# Patient Record
Sex: Female | Born: 1995 | Race: White | Hispanic: No | Marital: Single | Smoking: Current some day smoker
Health system: Southern US, Community
[De-identification: ages and names within clinical notes are randomized; demographics above are authoritative.]

## PROBLEM LIST (undated history)

## (undated) DIAGNOSIS — H539 Unspecified visual disturbance: Secondary | ICD-10-CM

## (undated) DIAGNOSIS — F99 Mental disorder, not otherwise specified: Secondary | ICD-10-CM

## (undated) DIAGNOSIS — G8929 Other chronic pain: Secondary | ICD-10-CM

## (undated) DIAGNOSIS — M25569 Pain in unspecified knee: Secondary | ICD-10-CM

## (undated) DIAGNOSIS — F419 Anxiety disorder, unspecified: Secondary | ICD-10-CM

## (undated) HISTORY — DX: Pain in unspecified knee: M25.569

## (undated) HISTORY — DX: Mental disorder, not otherwise specified: F99

## (undated) HISTORY — PX: NO PAST SURGERIES: SHX2092

## (undated) HISTORY — DX: Unspecified visual disturbance: H53.9

## (undated) HISTORY — DX: Other chronic pain: G89.29

---
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
## 2017-03-06 ENCOUNTER — Ambulatory Visit: Payer: Self-pay | Admitting: Orthopaedic Surgery

## 2017-09-08 ENCOUNTER — Encounter: Payer: Self-pay | Admitting: Nurse Practitioner

## 2017-09-08 ENCOUNTER — Ambulatory Visit: Payer: BLUE CROSS/BLUE SHIELD | Admitting: Nurse Practitioner

## 2017-09-08 VITALS — BP 124/82 | Ht 63.25 in | Wt 205.0 lb

## 2017-09-08 DIAGNOSIS — R5383 Other fatigue: Secondary | ICD-10-CM | POA: Diagnosis not present

## 2017-09-08 DIAGNOSIS — R0602 Shortness of breath: Secondary | ICD-10-CM | POA: Diagnosis not present

## 2017-09-08 DIAGNOSIS — Z113 Encounter for screening for infections with a predominantly sexual mode of transmission: Secondary | ICD-10-CM | POA: Diagnosis not present

## 2017-09-08 NOTE — Progress Notes (Signed)
Subjective: Presents for complaints of fatigue over the past year.  Lately describes herself as feeling "dead tired" at times.  Other than going to work, patient states all she wants to do asleep or staying in the bed.  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  Feels mildly winded at times with prolonged walking that only occurs after she is worked.  Otherwise no chest pain/ischemic type pain palpitations or shortness of breath.  Regular menses, normal flow lasting 3 to 4 days.  Moderate cramping right at the beginning but that is relieved with drinking a large amount of water.  Had a new sexual partner about 2 to 3 weeks ago.  Had one syncopal episode around 2 months ago.  Patient was at her previous job at [employer], had just come in from smoking a cigarette.  Her last meal was about 4 hours before.  Had been drinking plenty of fluids.  Patient felt like she was going to faint, leaned against the wall and then she passed out briefly.  Bystanders said she was out maybe 30 seconds, no more than a minute.  No seizure activity.  Was given water and a banana afterwards.  She was tired but was able to complete her shift at work.  Has felt a few episodes of mild fainty feeling but no further syncopal episodes.  Had one episode last week where she sat down quickly when she felt like this.  Has occasional brief cramping headache around the left parietal area lasting no more than 5 to 10 minutes.  No difficulty speaking or swallowing.  No visual changes.  No numbness or weakness of the face arms or legs.  NAD.  Alert, oriented.  TMs mildly retracted, no erythema. [facility] 09/08/2017 09/30/2016  Decreased Interest 3 1  Down, Depressed, Hopeless 2 1  PHQ - 2 Score 5 2  Altered sleeping 3 1  Tired, decreased energy 3 3  Change in appetite 2 1  Feeling bad or failure about yourself  1 1  Trouble concentrating 2 1   Moving slowly or fidgety/restless 1 0  Suicidal thoughts 1 1  PHQ-9 Score 18 10  Difficult doing work/chores Extremely dIfficult Somewhat difficult   GAD 7 : Generalized Anxiety Score 09/08/2017  Nervous, Anxious, on Edge 2  Control/stop worrying 0  Worry too much - different things 0  Trouble relaxing 0  Restless 1  Easily annoyed or irritable 2  Afraid - awful might happen 1  Total GAD 7 Score 6  Anxiety Difficulty Somewhat difficult      Objective:   BP 124/82   Ht 5' 3.25" (1.607 m)   Wt 205 lb (93 kg)   LMP 09/01/2017 (Approximate)   BMI 36.03 kg/m  Pupils equal and reactive to light.  EOMs intact without nystagmus.  Pharynx clear.  Neck supple with minimal adenopathy.  Thyroid nontender to palpation, no mass or goiter noted.  Lungs clear.  Heart regular rate rhythm.  No murmur gallop noted.  EKG normal.  Point-to-point localization normal limit.  Hand strength 5+ black.  Patellar reflexes normal.  Gait normal.  Romberg negative.  Urine hCG negative.  Assessment:  Fatigue, unspecified type - Plan: Chlamydia/Gonococcus/Trichomonas, NAA, CBC with Differential, TSH, T4, free, Comprehensive Metabolic Panel (CMET), Vitamin D (25 hydroxy), Hemoglobin A1c  Shortness of breath - Plan: EKG 12-Lead  Screen for STD (sexually transmitted  disease) - Plan: Chlamydia/Gonococcus/Trichomonas, NAA    Plan: Further follow-up based on lab results.  Feel that some of her fatigue may be related to mental health issues.  Shortness of breath is very specific, only associated with prolonged walking and only occurs after work, no other time.  Warning signs reviewed.  Recheck here as needed.  Discussed safe sex issues.  She is getting labs today. Will also discuss results of GAD and PHQ when labs come back.

## 2017-09-09 LAB — CBC WITH DIFFERENTIAL/PLATELET
Basophils Absolute: 0.1 10*3/uL (ref 0.0–0.2)
Basos: 1 %
EOS (ABSOLUTE): 0.2 10*3/uL (ref 0.0–0.4)
Eos: 3 %
Hematocrit: 44 % (ref 34.0–46.6)
Hemoglobin: 14.4 g/dL (ref 11.1–15.9)
Immature Grans (Abs): 0 10*3/uL (ref 0.0–0.1)
Immature Granulocytes: 0 %
Lymphocytes Absolute: 2.4 10*3/uL (ref 0.7–3.1)
Lymphs: 30 %
MCH: 30.6 pg (ref 26.6–33.0)
MCHC: 32.7 g/dL (ref 31.5–35.7)
MCV: 94 fL (ref 79–97)
Monocytes Absolute: 0.5 10*3/uL (ref 0.1–0.9)
Monocytes: 6 %
Neutrophils Absolute: 4.7 10*3/uL (ref 1.4–7.0)
Neutrophils: 60 %
Platelets: 384 10*3/uL (ref 150–450)
RBC: 4.7 x10E6/uL (ref 3.77–5.28)
RDW: 13 % (ref 12.3–15.4)
WBC: 7.9 10*3/uL (ref 3.4–10.8)

## 2017-09-09 LAB — HEMOGLOBIN A1C
Est. average glucose Bld gHb Est-mCnc: 103 mg/dL
Hgb A1c MFr Bld: 5.2 % (ref 4.8–5.6)

## 2017-09-09 LAB — COMPREHENSIVE METABOLIC PANEL
ALT: 13 IU/L (ref 0–32)
AST: 14 IU/L (ref 0–40)
Albumin/Globulin Ratio: 1.5 (ref 1.2–2.2)
Albumin: 4.3 g/dL (ref 3.5–5.5)
Alkaline Phosphatase: 76 IU/L (ref 39–117)
BUN/Creatinine Ratio: 13 (ref 9–23)
BUN: 9 mg/dL (ref 6–20)
Bilirubin Total: 0.4 mg/dL (ref 0.0–1.2)
CO2: 24 mmol/L (ref 20–29)
Calcium: 9.8 mg/dL (ref 8.7–10.2)
Chloride: 102 mmol/L (ref 96–106)
Creatinine, Ser: 0.68 mg/dL (ref 0.57–1.00)
GFR calc Af Amer: 147 mL/min/{1.73_m2} (ref >59)
GFR calc non Af Amer: 127 mL/min/{1.73_m2} (ref >59)
Globulin, Total: 2.8 g/dL (ref 1.5–4.5)
Glucose: 86 mg/dL (ref 65–99)
Potassium: 4.5 mmol/L (ref 3.5–5.2)
Sodium: 140 mmol/L (ref 134–144)
Total Protein: 7.1 g/dL (ref 6.0–8.5)

## 2017-09-09 LAB — T4, FREE: Free T4: 1.06 ng/dL (ref 0.93–1.60)

## 2017-09-09 LAB — TSH: TSH: 0.891 u[IU]/mL (ref 0.450–4.500)

## 2017-09-09 LAB — VITAMIN D 25 HYDROXY (VIT D DEFICIENCY, FRACTURES): Vit D, 25-Hydroxy: 28.2 ng/mL — ABNORMAL LOW (ref 30.0–100.0)

## 2017-09-10 ENCOUNTER — Encounter: Payer: Self-pay | Admitting: Family Medicine

## 2017-09-10 LAB — CHLAMYDIA/GONOCOCCUS/TRICHOMONAS, NAA
Chlamydia by NAA: NEGATIVE
Gonococcus by NAA: NEGATIVE
Trich vag by NAA: NEGATIVE

## 2017-10-28 ENCOUNTER — Other Ambulatory Visit: Payer: Self-pay | Admitting: *Deleted

## 2017-10-28 ENCOUNTER — Telehealth: Payer: Self-pay | Admitting: Family Medicine

## 2017-10-28 ENCOUNTER — Encounter: Payer: Self-pay | Admitting: Family Medicine

## 2017-10-28 ENCOUNTER — Ambulatory Visit: Payer: BLUE CROSS/BLUE SHIELD | Admitting: Family Medicine

## 2017-10-28 VITALS — BP 110/88 | Ht 63.25 in | Wt 213.0 lb

## 2017-10-28 DIAGNOSIS — F339 Major depressive disorder, recurrent, unspecified: Secondary | ICD-10-CM

## 2017-10-28 DIAGNOSIS — R5383 Other fatigue: Secondary | ICD-10-CM

## 2017-10-28 DIAGNOSIS — F411 Generalized anxiety disorder: Secondary | ICD-10-CM | POA: Diagnosis not present

## 2017-10-28 MED ORDER — ESCITALOPRAM OXALATE 10 MG PO TABS: ORAL_TABLET | ORAL | 5 refills | Status: DC

## 2017-10-28 NOTE — Progress Notes (Signed)
   Subjective:    Patient ID: Debra Stuart, female    DOB: 09/06/1995, 22 y.o.   MRN: [id number]  HPI  Patient is here today to follow up on anxiety and depression.  Pt has challenge with fatigue  Now ha a car  Living at home, overall decent with parents, occas squabbling  Considered moving , but did not   Long hx of feeling down, no suic thoughts  Has not had meds in the past  Pt has hx of tis, jerking at times, sometimes worse with anxiety, and tend s to occur during stress. ocurs about evey yr to year and a half  .        She states she is not on any antidepressants.  Review of Systems No headache, no major weight loss or weight gain, no chest pain no back pain abdominal pain no change in bowel habits complete ROS otherwise negative     Objective:   Physical Exam  Alert and oriented, vitals reviewed and stable, NAD ENT-TM's and ext canals WNL bilat via otoscopic exam Soft palate, tonsils and post pharynx WNL via oropharyngeal exam Neck-symmetric, no masses; thyroid nonpalpable and nontender Pulmonary-no tachypnea or accessory muscle use; Clear without wheezes via auscultation Card--no abnrml murmurs, rhythm reg and rate WNL Carotid pulses symmetric, without bruits       Assessment & Plan:  Impression depression with generalized anxiety disorder discussed.  Potential interventions discussed.  Patient is motivated to get started on some medication.  Notes substantial history within the family of this on both sides of the family.  Will initiate Lexapro.  Side effects discussed.  Of note patient was given Xanax by relative and had excessive sedation.  Warning signs discussed regarding the slight possibility that things can get worse.  Call us at once if this were to occur follow-up as scheduled.  Exercise also discussed.  Blood work also discussed at [facility]

## 2017-10-28 NOTE — Telephone Encounter (Signed)
[facility] in [place] did not have Lexapro in stock and patient wanted it sent to [facility] instead.

## 2017-10-28 NOTE — Patient Instructions (Signed)

## 2017-10-28 NOTE — Telephone Encounter (Signed)
Called [facility] and canceled rx and sent to [facility]

## 2017-11-19 ENCOUNTER — Ambulatory Visit (INDEPENDENT_AMBULATORY_CARE_PROVIDER_SITE_OTHER): Payer: BLUE CROSS/BLUE SHIELD | Admitting: Family Medicine

## 2017-11-19 ENCOUNTER — Encounter: Payer: Self-pay | Admitting: Family Medicine

## 2017-11-19 VITALS — BP 110/78 | Ht 63.0 in | Wt 212.6 lb

## 2017-11-19 DIAGNOSIS — F411 Generalized anxiety disorder: Secondary | ICD-10-CM

## 2017-11-19 DIAGNOSIS — F339 Major depressive disorder, recurrent, unspecified: Secondary | ICD-10-CM | POA: Diagnosis not present

## 2017-11-19 DIAGNOSIS — Z23 Encounter for immunization: Secondary | ICD-10-CM

## 2017-11-19 NOTE — Progress Notes (Signed)
   Subjective:    Patient ID: Debra LittenJulie Stuart, female    DOB: 06/03/1995, 22 y.o.   MRN: [id number]  HPIhaving some problems with taking lexapro. States she feels worse on the med. Stopped med 3 days ago. Wanting a lower dose of med.   Over time pt noted more on edge and feeling more anxious  Then mellowed out  Depression got to feeling worse  And tis got worse  Felt more frustrated  Called mom  Considered even going to the emergecy room  Stopped the meds, had more crying  And started feling beter fter the first day or so       Would like flu vaccine today.    Review of Systems No headache, no major weight loss or weight gain, no chest pain no back pain abdominal pain no change in bowel habits complete ROS otherwise negative     Objective:   Physical Exam Alert and oriented, vitals reviewed and stable, NAD ENT-TM's and ext canals WNL bilat via otoscopic exam Soft palate, tonsils and post pharynx WNL via oropharyngeal exam Neck-symmetric, no masses; thyroid nonpalpable and nontender Pulmonary-no tachypnea or accessory muscle use; Clear without wheezes via auscultation Card--no abnrml murmurs, rhythm reg and rate WNL Carotid pulses symmetric, without bruits        Assessment & Plan:  Impression depression/anxiety.  Worsening on current intervention.  Discussed.  In the past we recommended mental health referral and the patient declined this.  She is now more open minded to this.  Pt more receptie to h mh support.  We will hold off on further medication for now.  Rationale discussed.  Questions answered regarding referral process.  We will work on this  Greater than 50% of this 25 minute face to face visit was spent in counseling and discussion and coordination of care regarding the above diagnosis/diagnosies

## 2017-11-21 ENCOUNTER — Encounter: Payer: Self-pay | Admitting: Family Medicine

## 2017-12-09 ENCOUNTER — Ambulatory Visit: Payer: BLUE CROSS/BLUE SHIELD | Admitting: Family Medicine

## 2017-12-18 ENCOUNTER — Encounter: Payer: Self-pay | Admitting: Family Medicine

## 2017-12-18 ENCOUNTER — Ambulatory Visit (INDEPENDENT_AMBULATORY_CARE_PROVIDER_SITE_OTHER): Payer: BLUE CROSS/BLUE SHIELD | Admitting: Family Medicine

## 2017-12-18 VITALS — BP 110/72 | Temp 99.0°F | Ht 63.0 in | Wt 220.6 lb

## 2017-12-18 DIAGNOSIS — J329 Chronic sinusitis, unspecified: Secondary | ICD-10-CM | POA: Diagnosis not present

## 2017-12-18 DIAGNOSIS — J310 Chronic rhinitis: Secondary | ICD-10-CM

## 2017-12-18 MED ORDER — CEFPROZIL 500 MG PO TABS: 500.0000 mg | ORAL_TABLET | Freq: Two times a day (BID) | ORAL | 0 refills | Status: DC

## 2017-12-18 NOTE — Progress Notes (Signed)
   Subjective:    Patient ID: Debra Stuart, female    DOB: 07-11-1995, 22 y.o.   MRN: [id number]  Sinusitis  This is a new problem. The current episode started yesterday. Associated symptoms include congestion, coughing, headaches and a sore throat. (Wheezing ) Treatments tried: dayquil.   Sore throt nose dry and raw  Head hurting all over aching,   And facil tend and discomfort   Raw in the chest   Ps productive      low gr feve  No nausea no queazzy  No major pain   dayquil prn     Review of Systems  HENT: Positive for congestion and sore throat.   Respiratory: Positive for cough.   Neurological: Positive for headaches.       Objective:   Physical Exam  Alert, mild malaise. Hydration good Vitals stable. frontal/ maxillary tenderness evident positive nasal congestion. pharynx normal neck supple  lungs clear/no crackles or wheezes. heart regular in rhythm       Assessment & Plan:  Impression rhinosinusitis likely post viral, discussed with patient. plan antibiotics prescribed. Questions answered. Symptomatic care discussed. warning signs discussed. WSL
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
## 2018-01-12 ENCOUNTER — Encounter (HOSPITAL_COMMUNITY): Payer: Self-pay | Admitting: Psychiatry

## 2018-01-12 ENCOUNTER — Ambulatory Visit (INDEPENDENT_AMBULATORY_CARE_PROVIDER_SITE_OTHER): Payer: BLUE CROSS/BLUE SHIELD | Admitting: Psychiatry

## 2018-01-12 VITALS — BP 132/82 | HR 108 | Ht 63.0 in | Wt 211.0 lb

## 2018-01-12 DIAGNOSIS — F331 Major depressive disorder, recurrent, moderate: Secondary | ICD-10-CM | POA: Diagnosis not present

## 2018-01-12 MED ORDER — SERTRALINE HCL 50 MG PO TABS: ORAL_TABLET | ORAL | 0 refills | Status: DC

## 2018-01-12 NOTE — Patient Instructions (Addendum)
1. Start sertraline 25 mg daily for one week, then 50 mg daily  2. Return to clinic in one month for 30 mins  CONTACT INFORMATION  What to do if you need to get in touch with someone regarding a psychiatric issue:  1. EMERGENCY: For psychiatric emergencies (if you are suicidal or if there are any other safety issues) call 911 and/or go to your nearest Emergency Room immediately.   2. IF YOU NEED SOMEONE TO TALK TO RIGHT NOW: Given my clinical responsibilities, I may not be able to speak with you over the phone for a prolonged period of time.  a. You may always call The National Suicide Prevention Lifeline at 1-800-273-TALK 418-406-9816(8255).  b. Your county of residence will also have local crisis services. [place] [facility]: [facility] at [phone] (24 Hour Crisis Hotline)
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
## 2018-02-16 ENCOUNTER — Ambulatory Visit (INDEPENDENT_AMBULATORY_CARE_PROVIDER_SITE_OTHER): Payer: BLUE CROSS/BLUE SHIELD | Admitting: Psychiatry

## 2018-02-16 ENCOUNTER — Encounter (HOSPITAL_COMMUNITY): Payer: Self-pay | Admitting: Psychiatry

## 2018-02-16 VITALS — BP 116/69 | HR 67 | Ht 63.0 in | Wt 212.0 lb

## 2018-02-16 DIAGNOSIS — F330 Major depressive disorder, recurrent, mild: Secondary | ICD-10-CM

## 2018-02-16 MED ORDER — SERTRALINE HCL 50 MG PO TABS: 50.0000 mg | ORAL_TABLET | Freq: Every day | ORAL | 0 refills | Status: DC

## 2018-02-16 NOTE — Patient Instructions (Signed)
1. Continue sertraline 50 mg at night  2. Return to clinic in two months for 15 mins

## 2018-02-23 ENCOUNTER — Ambulatory Visit: Payer: BLUE CROSS/BLUE SHIELD | Admitting: Family Medicine

## 2018-02-23 ENCOUNTER — Encounter: Payer: Self-pay | Admitting: Family Medicine

## 2018-02-23 VITALS — BP 120/78 | Ht 63.0 in | Wt 218.0 lb

## 2018-02-23 DIAGNOSIS — R569 Unspecified convulsions: Secondary | ICD-10-CM | POA: Diagnosis not present
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
## 2018-03-05 ENCOUNTER — Encounter: Payer: Self-pay | Admitting: Neurology

## 2018-03-05 ENCOUNTER — Other Ambulatory Visit: Payer: Self-pay

## 2018-03-05 ENCOUNTER — Telehealth: Payer: Self-pay | Admitting: Neurology

## 2018-03-05 ENCOUNTER — Ambulatory Visit (INDEPENDENT_AMBULATORY_CARE_PROVIDER_SITE_OTHER): Payer: PRIVATE HEALTH INSURANCE | Admitting: Neurology

## 2018-03-05 VITALS — BP 117/84 | HR 105 | Resp 18 | Ht 63.0 in | Wt 210.0 lb

## 2018-03-05 DIAGNOSIS — R251 Tremor, unspecified: Secondary | ICD-10-CM

## 2018-03-05 NOTE — Telephone Encounter (Signed)
Medicaid order sent to GI. They will obtain the auth and reach out to the pt to schedule.  °

## 2018-03-05 NOTE — Progress Notes (Signed)
PATIENT: Debra Stuart DOB: 08/21/1995  Chief Complaint  Patient presents with  . Possible Seizures    PCP: Ardyth GalWilliam Luking. R/O sz./fim  . PCP     HISTORICAL  Debra Stuart is a 23 years old female, accompanied by her boyfriend, seen in request by her primary care physician Dr. Merlyn AlbertLuking, William S for uncontrollable body jerking movement, initial evaluation was on March 05, 2018.  I have reviewed and summarized the referring note from the referring physician.  She had a past medical history of depression anxiety, was recently started on treatment with Zoloft 50 mg at bedtime in November 2019.  Around that time, she had a worsening uncontrollable body jerking spells, she began to have this uncontrollable sudden body jerking movement around age 23, no loss of consciousness, variable form different times, usually triggered by stress, I witnessed she has forceful eye blinking, neck jerking movement during interview, lasting for few seconds, no loss of consciousness, reported not under voluntary control.  She also reported different forms of body jerking, including body thrashing like a fish on the floor, with no loss of consciousness, most recent one was 1 PM today right before her appointment, 1 hour after she find out that her cat has passed away, she was really upset,  She denies self injury during those spells, no tongue biting, no falling out of bed, no incontinence, it can happen few times a day, or every few days,  She reported father suffered seizure disorder, she desires further evaluation  Laboratory evaluation in July 2019 showed normal negative CBC, TSH, free T4, CMP, vitamin D was slightly decreased 28.2, A1c,  REVIEW OF SYSTEMS: Full 14 system review of systems performed and notable only for memory loss, confusion, weakness, depression, anxiety, too much sleep, decreased energy, disinterested in activities, racing thoughts, feeling cold, All other review of systems were  negative.  ALLERGIES: No Known Allergies  HOME MEDICATIONS: Current Outpatient Medications  Medication Sig Dispense Refill  . Biotin 1000 MCG CHEW Chew by mouth daily.    . Ibuprofen (ADVIL) 200 MG CAPS Take by mouth.    . levonorgestrel (MIRENA) 20 MCG/24HR IUD 1 each by Intrauterine route once.    . Multiple Vitamin (MULTIVITAMIN) capsule Take 1 capsule by mouth daily.    Marland Kitchen. OVER THE COUNTER MEDICATION Vit D 2000 mg once daily.    . sertraline (ZOLOFT) 50 MG tablet Take 1 tablet (50 mg total) by mouth at bedtime. 90 tablet 0   No current facility-administered medications for this visit.     PAST MEDICAL HISTORY: Past Medical History:  Diagnosis Date  . Chronic knee pain   . Chronic mental illness   . Vision abnormalities     PAST SURGICAL HISTORY: No past surgical history on file.  FAMILY HISTORY: Family History  Problem Relation Age of Onset  . Seizures Father   . Depression Father   . Drug abuse Father   . Anxiety disorder Sister   . OCD Sister   . Obsessive Compulsive Disorder Sister   . Depression Mother   . Depression Half-Brother     SOCIAL HISTORY: Social History   Socioeconomic History  . Marital status: Single    Spouse name: Not on file  . Number of children: Not on file  . Years of education: Not on file  . Highest education level: Not on file  Occupational History  . Not on file  Social Needs  . Financial resource strain: Not on file  .  Food insecurity:    Worry: Not on file    Inability: Not on file  . Transportation needs:    Medical: Not on file    Non-medical: Not on file  Tobacco Use  . Smoking status: Current Every Day Smoker    Types: E-cigarettes  . Smokeless tobacco: Never Used  Substance and Sexual Activity  . Alcohol use: No  . Drug use: No  . Sexual activity: Not Currently    Birth control/protection: I.U.D.  Lifestyle  . Physical activity:    Days per week: Not on file    Minutes per session: Not on file  . Stress:  Not on file  Relationships  . Social connections:    Talks on phone: Not on file    Gets together: Not on file    Attends religious service: Not on file    Active member of club or organization: Not on file    Attends meetings of clubs or organizations: Not on file    Relationship status: Not on file  . Intimate partner violence:    Fear of current or ex partner: Not on file    Emotionally abused: Not on file    Physically abused: Not on file    Forced sexual activity: Not on file  Other Topics Concern  . Not on file  Social History Narrative  . Not on file     PHYSICAL EXAM   Vitals:   03/05/18 1514  BP: 117/84  Pulse: (!) 105  Resp: 18  Weight: 210 lb (95.3 kg)  Height: 5\' 3"  (1.6 m)    Not recorded      Body mass index is 37.2 kg/m.  PHYSICAL EXAMNIATION:  Gen: NAD, conversant, well nourised, obese, well groomed                     Cardiovascular: Regular rate rhythm, no peripheral edema, warm, nontender. Eyes: Conjunctivae clear without exudates or hemorrhage Neck: Supple, no carotid bruits. Pulmonary: Clear to auscultation bilaterally   NEUROLOGICAL EXAM:  MENTAL STATUS: Speech:    Speech is normal; fluent and spontaneous with normal comprehension.  Cognition:     Orientation to time, place and person     Normal recent and remote memory     Normal Attention span and concentration     Normal Language, naming, repeating,spontaneous speech     Fund of knowledge   CRANIAL NERVES: CN II: Visual fields are full to confrontation. Fundoscopic exam is normal with sharp discs and no vascular changes. Pupils are round equal and briskly reactive to light. CN III, IV, VI: extraocular movement are normal. No ptosis. CN V: Facial sensation is intact to pinprick in all 3 divisions bilaterally. Corneal responses are intact.  CN VII: Face is symmetric with normal eye closure and smile. CN VIII: Hearing is normal to rubbing fingers CN IX, X: Palate elevates  symmetrically. Phonation is normal. CN XI: Head turning and shoulder shrug are intact CN XII: Tongue is midline with normal movements and no atrophy.  MOTOR: There is no pronator drift of out-stretched arms. Muscle bulk and tone are normal. Muscle strength is normal.  REFLEXES: Reflexes are 2+ and symmetric at the biceps, triceps, knees, and ankles. Plantar responses are flexor.  SENSORY: Intact to light touch, pinprick, positional sensation and vibratory sensation are intact in fingers and toes.  COORDINATION: Rapid alternating movements and fine finger movements are intact. There is no dysmetria on finger-to-nose and heel-knee-shin.  GAIT/STANCE: Posture is normal. Gait is steady with normal steps, base, arm swing, and turning. Heel and toe walking are normal. Tandem gait is normal.  Romberg is absent.   DIAGNOSTIC DATA (LABS, IMAGING, TESTING) - I reviewed patient records, labs, notes, testing and imaging myself where available.   ASSESSMENT AND PLAN  Milton Dorion is a 23 y.o. female   Body jerking spells  Semiology does not suggest the seizure disorder,  The witnessed sudden not jerking spells today actually suggestive of motor tics  Family history of epilepsy, she desires further evaluation, proceed with MRI of the brain with without contrast, EEG, will call her report,  Continue to work with her primary care, and psychiatrist for better mood control, will only return to clinic for new issues,  Levert Feinstein, M.D. Ph.D.  [facility] [address] [place], [place] [address] Ph: [phone] Fax: [phone]  CC: Merlyn Albert, MD

## 2018-03-06 ENCOUNTER — Encounter: Payer: Self-pay | Admitting: Neurology

## 2018-03-09 NOTE — Telephone Encounter (Signed)
Spoke to the patient she is aware.  °

## 2018-03-10 ENCOUNTER — Ambulatory Visit (INDEPENDENT_AMBULATORY_CARE_PROVIDER_SITE_OTHER): Payer: PRIVATE HEALTH INSURANCE | Admitting: Psychiatry

## 2018-03-10 ENCOUNTER — Encounter (HOSPITAL_COMMUNITY): Payer: Self-pay | Admitting: Psychiatry

## 2018-03-10 DIAGNOSIS — F330 Major depressive disorder, recurrent, mild: Secondary | ICD-10-CM
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
## 2018-03-18 ENCOUNTER — Other Ambulatory Visit: Payer: Self-pay | Admitting: Neurology

## 2018-03-20 ENCOUNTER — Other Ambulatory Visit: Payer: Self-pay

## 2018-03-25 ENCOUNTER — Other Ambulatory Visit: Payer: Self-pay | Admitting: *Deleted

## 2018-03-25 DIAGNOSIS — R251 Tremor, unspecified: Secondary | ICD-10-CM

## 2018-03-25 NOTE — Telephone Encounter (Signed)
Her insurance [employer] [place] is not in net work with GI that is where she was scheduled at.Debra Stuart is. When you get a chance can you put a new order in for [facility] and I will get her schedule there. The patient is aware.

## 2018-03-25 NOTE — Telephone Encounter (Signed)
Am better [place] Auth: [id number] (exp. 03/25/18 to 04/24/18) Medicaid Berkley Harvey: [id number] (exp. 03/25/18 to 04/24/18)  Patient is scheduled at [facility] for 04/02/18 arrival time is 10:30 AM. Patient is aware of time and day I did give her their number of [phone] incase she needed to r/s for any reason.

## 2018-03-25 NOTE — Telephone Encounter (Signed)
Noted, thank you

## 2018-03-30 ENCOUNTER — Other Ambulatory Visit: Payer: Self-pay

## 2018-04-02 ENCOUNTER — Ambulatory Visit (HOSPITAL_COMMUNITY): Payer: PRIVATE HEALTH INSURANCE

## 2018-04-06 ENCOUNTER — Other Ambulatory Visit: Payer: PRIVATE HEALTH INSURANCE
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
## 2018-04-20 ENCOUNTER — Ambulatory Visit (INDEPENDENT_AMBULATORY_CARE_PROVIDER_SITE_OTHER): Payer: PRIVATE HEALTH INSURANCE | Admitting: Psychiatry

## 2018-04-20 VITALS — BP 110/75 | HR 92 | Ht 63.0 in | Wt 218.0 lb

## 2018-04-20 DIAGNOSIS — F330 Major depressive disorder, recurrent, mild: Secondary | ICD-10-CM

## 2018-04-20 MED ORDER — VENLAFAXINE HCL ER 75 MG PO CP24: 75.0000 mg | ORAL_CAPSULE | Freq: Every day | ORAL | 1 refills | Status: DC

## 2018-04-20 MED ORDER — VENLAFAXINE HCL ER 37.5 MG PO CP24: ORAL_CAPSULE | ORAL | 0 refills | Status: DC

## 2018-04-20 NOTE — Patient Instructions (Signed)
1. Decrease sertraline 25 mg daily for one week, then discontinue 2. Start venlafaxine 37.5 mg daily for one week, then 75 mg daily  3. Return to clinic in two months for 15 mins

## 2018-04-27 ENCOUNTER — Ambulatory Visit (INDEPENDENT_AMBULATORY_CARE_PROVIDER_SITE_OTHER): Payer: PRIVATE HEALTH INSURANCE | Admitting: Psychiatry

## 2018-04-27 DIAGNOSIS — F330 Major depressive disorder, recurrent, mild: Secondary | ICD-10-CM
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
## 2018-05-11 ENCOUNTER — Ambulatory Visit (HOSPITAL_COMMUNITY): Payer: PRIVATE HEALTH INSURANCE | Admitting: Psychiatry

## 2018-05-18 ENCOUNTER — Telehealth: Payer: Self-pay | Admitting: Family Medicine

## 2018-05-18 MED ORDER — BENZONATATE 100 MG PO CAPS: ORAL_CAPSULE | ORAL | 0 refills | Status: DC

## 2018-05-18 NOTE — Telephone Encounter (Signed)
Throat does not hurt. Fever last night of 99.7 oral. Productive cough, no temp this morning. Can hear a little rattle when cough, using inhaler. Runny nose. Claritin given and did help a little bit. Pt mom states that it is possible allergies.

## 2018-05-18 NOTE — Telephone Encounter (Signed)
symmtom care only, canadd tess perles 100 mg one q six hr prn cough 30, likely allergy with maybe mild virus, chance of serious virus wtill low in community

## 2018-05-18 NOTE — Telephone Encounter (Signed)
Patient experiencing cough, and sore throat, mother states it's possibly allergy related, no sob, no fever, no diarrhea, no nausea or vomiting, advise.     Pharmacy:  [facility] - [place], [place] - [address]

## 2018-05-18 NOTE — Telephone Encounter (Signed)
Mom contacted and informed to do symptomatic care only but can add tess perles. Medication sent in and mom verbalized understanding.

## 2018-06-01 ENCOUNTER — Ambulatory Visit (INDEPENDENT_AMBULATORY_CARE_PROVIDER_SITE_OTHER): Payer: PRIVATE HEALTH INSURANCE | Admitting: Psychiatry

## 2018-06-01 ENCOUNTER — Other Ambulatory Visit: Payer: Self-pay

## 2018-06-01 DIAGNOSIS — F330 Major depressive disorder, recurrent, mild: Secondary | ICD-10-CM | POA: Diagnosis not present

## 2018-06-01 NOTE — Progress Notes (Signed)
  Virtual Visit via Video Note  I connected with Debra Stuart on 06/01/18 at 11:00 AM EDT by a video enabled telemedicine application and verified that I am speaking with the correct person using two identifiers.   I discussed the limitations of evaluation and management by telemedicine and the availability of in person appointments. The patient expressed understanding and agreed to proceed.  I provided 45 minutes of non-face-to-face time during this encounter.   Adah Salvage, LCSW   THERAPIST PROGRESS NOTE  Session Time: Monday 06/01/2018 11:00 AM - 11:45 AM   Participation Level: Active  Behavioral Response: Casual/alert, anxious  Type of Therapy: Individual Therapy  Treatment Goals addressed: establish rapport, learn and implement cognitive and behavioral strategies to overcome depression  Interventions: CBT and Supportive  Summary: Debra Stuart is a 23 y.o. female who is referred for services by psychiatrist Dr. Vanetta Shawl due to experiencing symptoms of anxiety and depression. Symptoms began about 7 years ago. Patient denies any previous psychiatric hospitalizations. She was seen in this practice for an assessment appointment in 2018. Patient recently started taking zoloft as prescribed by psyichiatrist Dr. Vanetta Shawl. She reports periods of depression and difficulty coping with stress. Per her report, her response is extreme when she becomes stressed. She screams and and has panic attacks. Other symptoms include difficulty concentrating, fatigue, change in appetite, and irritability.   Patient last was seen in March 2020. She reports less depressed mood but increased stress and anxiety related to impact of coronavirus. She works as a Conservation officer, nature for [employer] and reports stress related to negative comments from customers. She also fears she could possibly bring home germs to her father who has COPD and her mother and sister who both have asthma. She reports adequate sleep and improved eating  patterns. She has maintained contact with boyfriend. Patient reports she has been using deep breathing and self-talk to try to cope.   Suicidal/Homicidal: Nowithout intent/plan  Therapist Response:  reviewed symptoms, praised and reinforced patient's improved self-care regarding nutrition/use of deep breathing/ and self-talk, discussed effects,  discussed stressors, facilitated expression of thoughts and feelings, validated and normalized feelings, discussed ways patient can use support system at work and the precautions patient is using,  assisted patient identify ways to improve self-care regarding exercise, developed plan for patient to walk 20 minutes per day 2 days a week, assisted patient practice a relaxation technique using imagery, developed plan with patient to do imagery/meditation daily,   Plan: Return again in 2 weeks.  Diagnosis: Axis I: MDD    Axis II: Deferred    Adah Salvage, LCSW 06/01/2018

## 2018-06-02 ENCOUNTER — Ambulatory Visit (INDEPENDENT_AMBULATORY_CARE_PROVIDER_SITE_OTHER): Payer: Self-pay | Admitting: Family Medicine

## 2018-06-02 DIAGNOSIS — R21 Rash and other nonspecific skin eruption: Secondary | ICD-10-CM

## 2018-06-02 MED ORDER — TRIAMCINOLONE ACETONIDE 0.1 % EX CREA: 1.0000 "application " | TOPICAL_CREAM | Freq: Two times a day (BID) | CUTANEOUS | 0 refills | Status: DC

## 2018-06-02 NOTE — Progress Notes (Signed)
   Subjective:    Patient ID: Debra Stuart, female    DOB: 09-20-1995, 22 y.o.   MRN: [id number] Visit done video and audio Rash  This is a new problem. Episode onset: 3 -4 days ago. The affected locations include the face (spreading to left shoulder). The rash is characterized by burning and peeling. Associated with: used a different facial cleanser the day before rash broke out. Treatments tried: benadryl, hydrocortisone.   Virtual Visit via Telephone Note  I connected with Debra Stuart on 06/02/18 at  1:40 PM EDT by telephone and verified that I am speaking with the correct person using two identifiers.   I discussed the limitations, risks, security and privacy concerns of performing an evaluation and management service by telephone and the availability of in person appointments. I also discussed with the patient that there may be a patient responsible charge related to this service. The patient expressed understanding and agreed to proceed.   History of Present Illness:    Observations/Objective:   Assessment and Plan:   Follow Up Instructions:    I discussed the assessment and treatment plan with the patient. The patient was provided an opportunity to ask questions and all were answered. The patient agreed with the plan and demonstrated an understanding of the instructions.   The patient was advised to call back or seek an in-person evaluation if the symptoms worsen or if the condition fails to improve as anticipated.  I provided 10 minutes of non-face-to-face time during this encounter.     Review of Systems  Skin: Positive for rash.       Objective:   Physical Exam  Mild erythematous patchy pruritic rash forehead cheeks had applied and new cosmetic      Assessment & Plan:  Impression hypersensitive rash plan triamcinolone cream twice daily to affected area symptom care discussed

## 2018-06-08 IMAGING — MR MR KNEE*L* W/O CM
4 of 6 series · 18 of 40 positions shown · non-contrast
Comparison: Radiographs 09/30/2016

CLINICAL DATA: Fell at work 1 year ago.  Persistent knee pain.

EXAM:
MRI OF THE LEFT KNEE WITHOUT CONTRAST
TECHNIQUE: Multiplanar, multisequence MR imaging of the knee was performed. No
intravenous contrast was administered.

[Series 6: pdfs sag · sagittal · 3.0mm · 0.24mm/px · 5 of 28 slices shown]
[im 1/28]
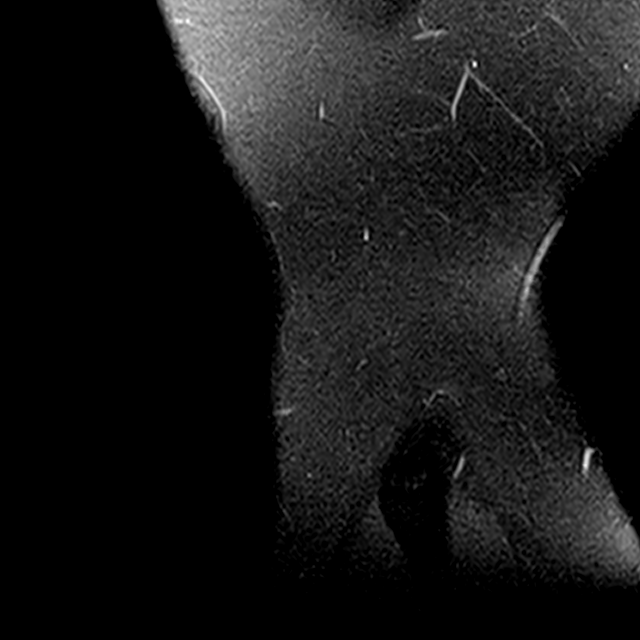
[im 5/28]
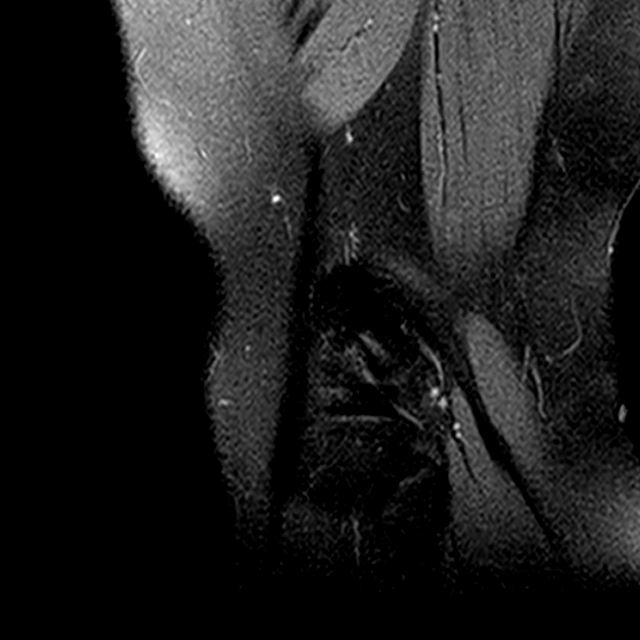
[im 10/28]
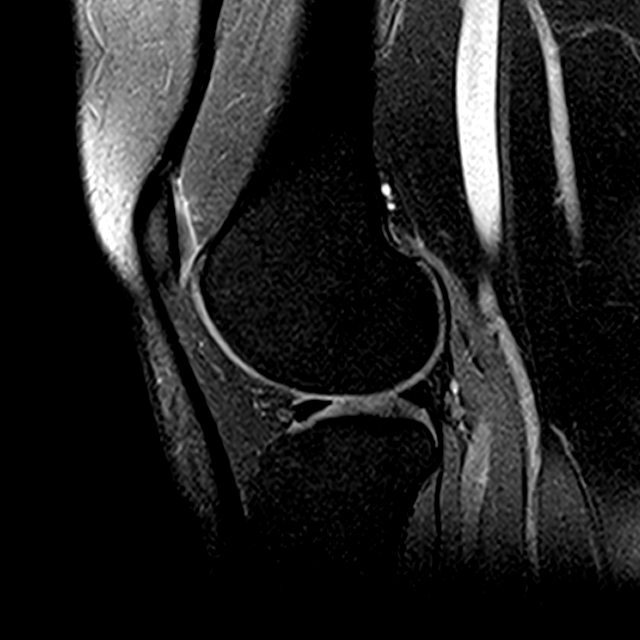
[im 14/28]
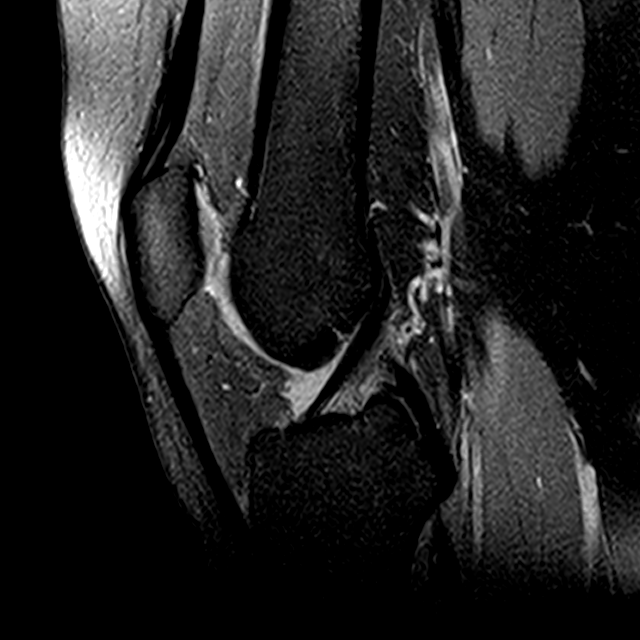
[im 23/28]
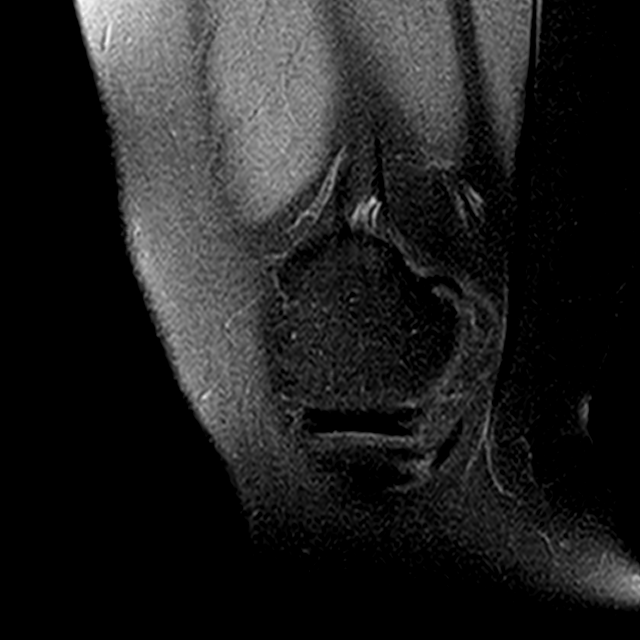

[Series 10: pdfs axial blade · axial · 3.0mm · 0.61mm/px · z∈[-83,-11]mm · 3 of 30 slices shown]
[im 5/30]
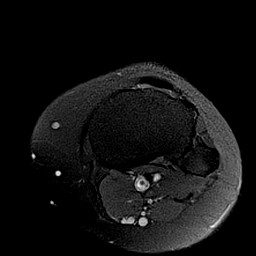
[im 17/30]
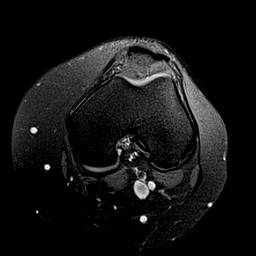
[im 25/30]
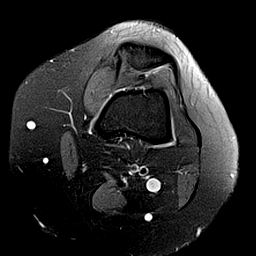

[Series 11: pdfs cor blade · coronal · 3.0mm · 0.51mm/px · 3 of 24 slices shown]
[im 4/24]
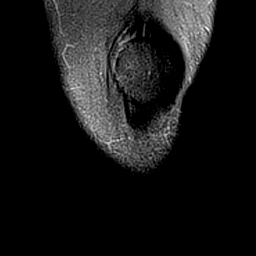
[im 12/24]
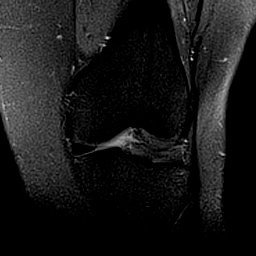
[im 20/24]
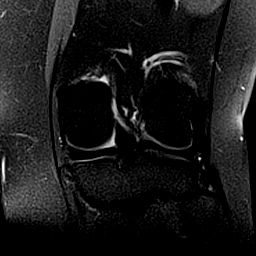

[Series 13: T1 · coronal · 3.0mm · 0.20mm/px · 7 of 24 slices shown]
[im 1/24]
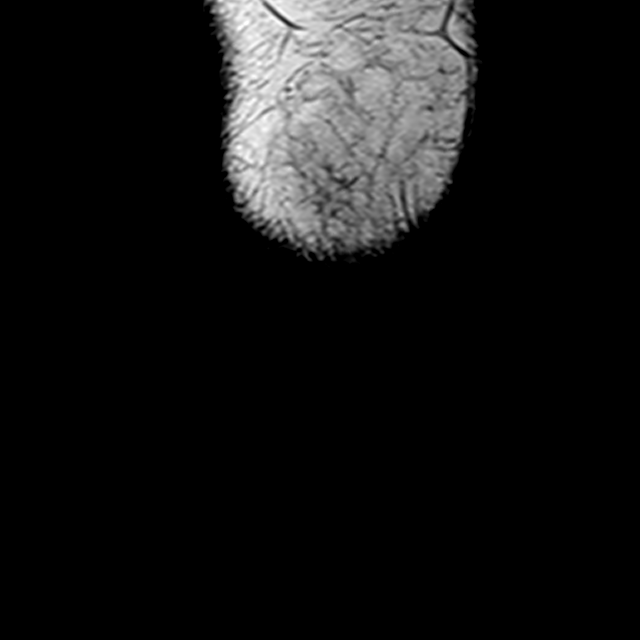
[im 4/24]
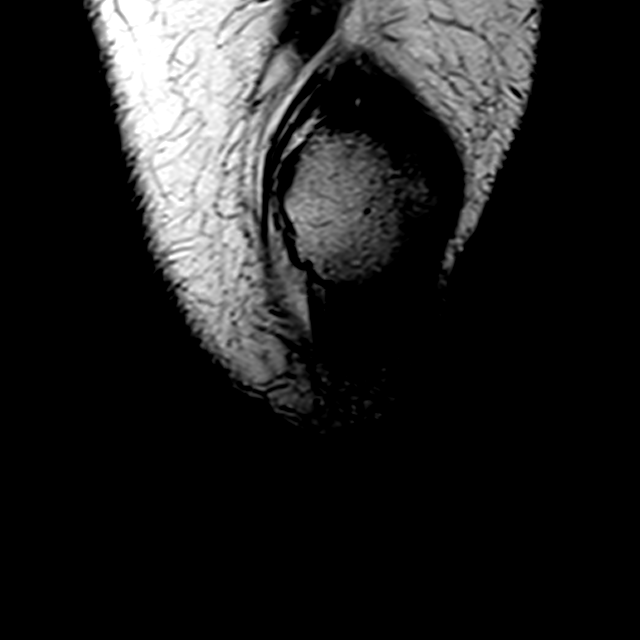
[im 8/24]
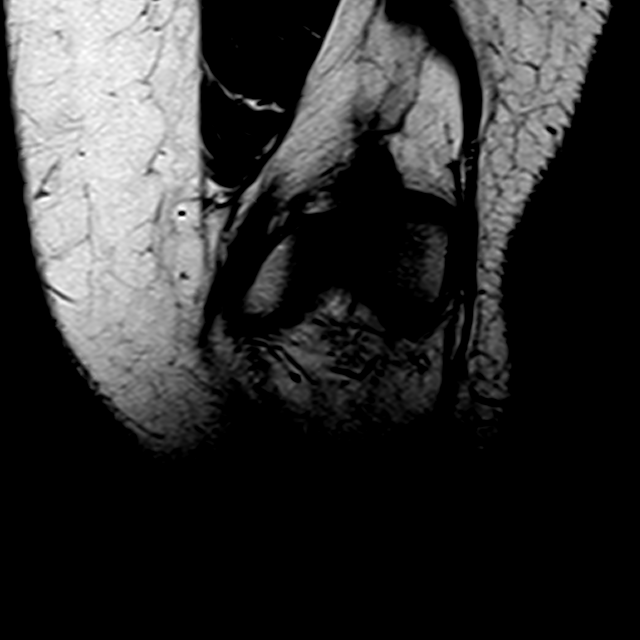
[im 12/24]
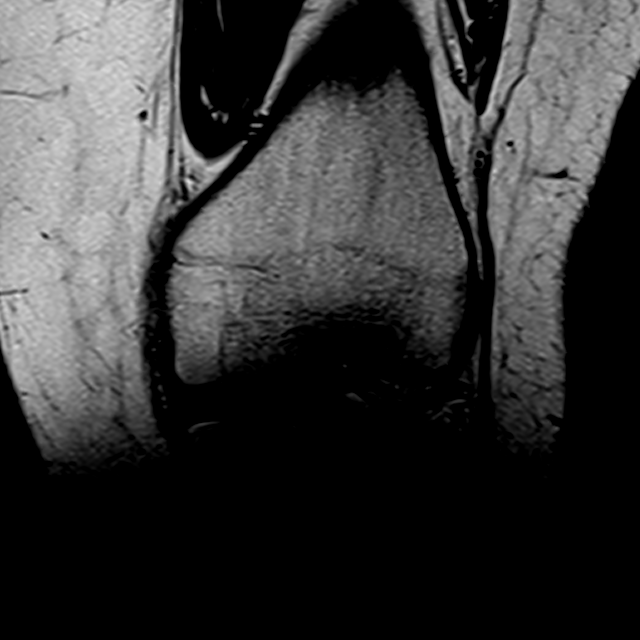
[im 16/24]
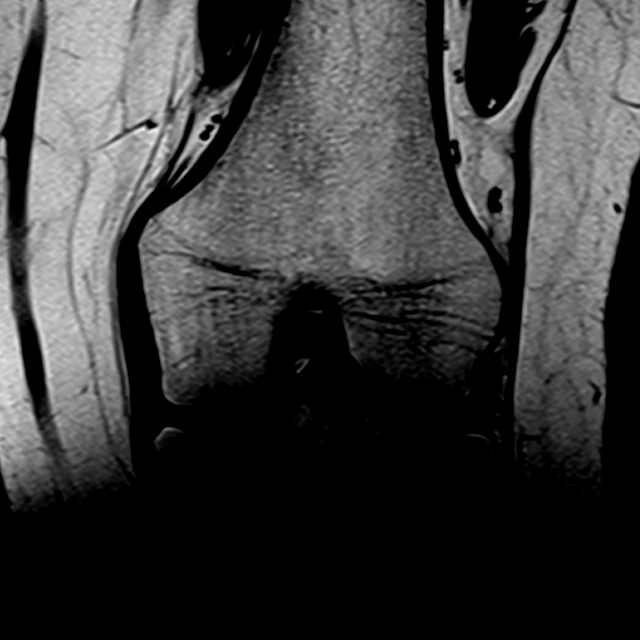
[im 20/24]
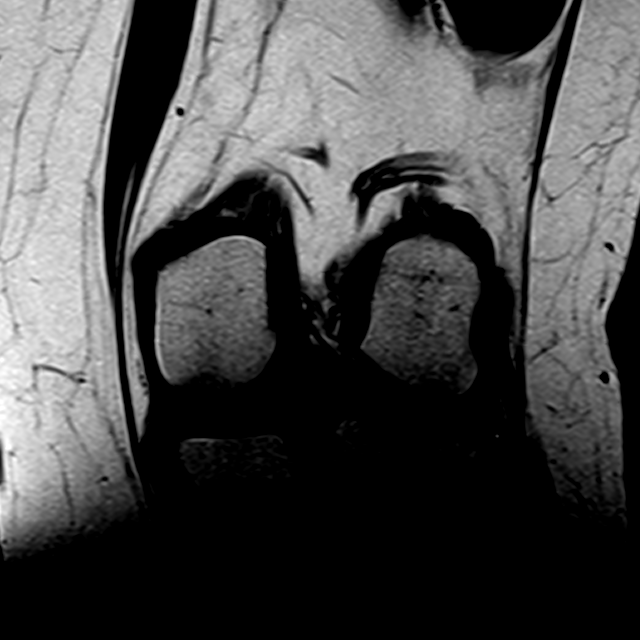
[im 24/24]
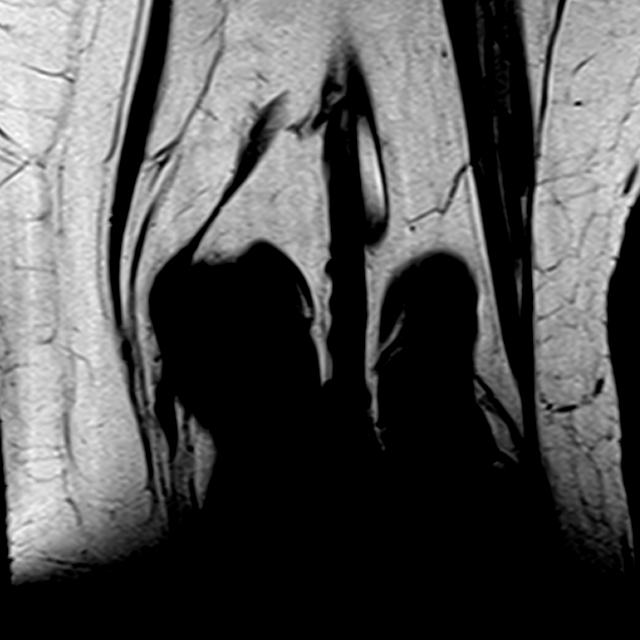

[18 of 40 positions shown; findings below may reference images not displayed]

FINDINGS: MENISCI

Medial meniscus:  Intact

Lateral meniscus:  Intact

LIGAMENTS

Cruciates:  Intact

Collaterals:  Intact

CARTILAGE

Patellofemoral:  Normal

Medial:  Normal

Lateral:  Normal

Joint:  No joint effusion or synovitis.

Popliteal Fossa:  No popliteal mass or Baker's cyst.

Extensor Mechanism: The patella retinacular structures are intact an
the quadriceps and patellar tendons are intact.

Bones: No acute bony findings. No bone lesions. No osteochondral
abnormality.

Other: Normal knee musculature.
IMPRESSION: Unremarkable MR examination of the knee. No findings for internal
derangement.

## 2018-06-15 ENCOUNTER — Ambulatory Visit (HOSPITAL_COMMUNITY): Payer: PRIVATE HEALTH INSURANCE | Admitting: Psychiatry

## 2018-06-15 ENCOUNTER — Other Ambulatory Visit: Payer: Self-pay
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
## 2018-06-22 ENCOUNTER — Encounter (HOSPITAL_COMMUNITY): Payer: Self-pay | Admitting: Psychiatry

## 2018-06-22 ENCOUNTER — Other Ambulatory Visit: Payer: Self-pay

## 2018-06-22 ENCOUNTER — Ambulatory Visit (INDEPENDENT_AMBULATORY_CARE_PROVIDER_SITE_OTHER): Payer: PRIVATE HEALTH INSURANCE | Admitting: Psychiatry

## 2018-06-22 DIAGNOSIS — F330 Major depressive disorder, recurrent, mild: Secondary | ICD-10-CM

## 2018-06-22 MED ORDER — VENLAFAXINE HCL ER 75 MG PO CP24: 75.0000 mg | ORAL_CAPSULE | Freq: Every day | ORAL | 1 refills | Status: DC

## 2018-06-22 NOTE — Patient Instructions (Addendum)
1. Start venlafaxine 75 mg daily 2. Next appointment 6/15 at 3:50

## 2018-06-29 ENCOUNTER — Other Ambulatory Visit: Payer: Self-pay

## 2018-06-29 ENCOUNTER — Ambulatory Visit (HOSPITAL_COMMUNITY): Payer: PRIVATE HEALTH INSURANCE | Admitting: Psychiatry
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
## 2018-08-10 ENCOUNTER — Other Ambulatory Visit: Payer: Self-pay

## 2018-08-10 ENCOUNTER — Encounter (HOSPITAL_COMMUNITY): Payer: Self-pay | Admitting: Psychiatry

## 2018-08-10 ENCOUNTER — Ambulatory Visit (INDEPENDENT_AMBULATORY_CARE_PROVIDER_SITE_OTHER): Payer: PRIVATE HEALTH INSURANCE | Admitting: Psychiatry

## 2018-08-10 DIAGNOSIS — F330 Major depressive disorder, recurrent, mild: Secondary | ICD-10-CM | POA: Diagnosis not present

## 2018-08-10 DIAGNOSIS — F411 Generalized anxiety disorder: Secondary | ICD-10-CM | POA: Diagnosis not present

## 2018-08-10 MED ORDER — VENLAFAXINE HCL ER 75 MG PO CP24: 75.0000 mg | ORAL_CAPSULE | Freq: Every day | ORAL | 0 refills | Status: DC

## 2018-08-10 NOTE — Patient Instructions (Signed)
1. Continue venlafaxine 75 mg daily 2.Next appointment: 8/3 at 11 AM
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
## 2018-09-28 ENCOUNTER — Ambulatory Visit (INDEPENDENT_AMBULATORY_CARE_PROVIDER_SITE_OTHER): Payer: PRIVATE HEALTH INSURANCE | Admitting: Psychiatry

## 2018-09-28 ENCOUNTER — Other Ambulatory Visit: Payer: Self-pay

## 2018-09-28 ENCOUNTER — Encounter (HOSPITAL_COMMUNITY): Payer: Self-pay | Admitting: Psychiatry

## 2018-09-28 DIAGNOSIS — R569 Unspecified convulsions: Secondary | ICD-10-CM

## 2018-09-28 DIAGNOSIS — F330 Major depressive disorder, recurrent, mild: Secondary | ICD-10-CM

## 2018-09-28 MED ORDER — VENLAFAXINE HCL ER 75 MG PO TB24: 75.0000 mg | ORAL_TABLET | Freq: Every day | ORAL | 1 refills | Status: DC

## 2018-09-28 NOTE — Patient Instructions (Signed)
1. Reinitiate venlafaxine 75 mg daily 2.Next appointment: 9/22 at 1:40
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
## 2018-11-17 ENCOUNTER — Telehealth (HOSPITAL_COMMUNITY): Payer: Self-pay | Admitting: Psychiatry

## 2018-11-17 ENCOUNTER — Ambulatory Visit (HOSPITAL_COMMUNITY): Payer: PRIVATE HEALTH INSURANCE | Admitting: Psychiatry

## 2018-11-17 ENCOUNTER — Other Ambulatory Visit: Payer: Self-pay

## 2018-11-17 NOTE — Telephone Encounter (Signed)
Sent link for video visit through [employer] [facility]. Patient did not sign in. Called the patient  twice for appointment scheduled today. The patient did not answer the phone. Left voice message to contact the office.  

## 2019-03-24 ENCOUNTER — Encounter: Payer: Self-pay | Admitting: Family Medicine

## 2019-03-25 ENCOUNTER — Encounter: Payer: Self-pay | Admitting: Family Medicine

## 2019-04-06 ENCOUNTER — Encounter: Payer: Self-pay | Admitting: Family Medicine

## 2019-04-06 ENCOUNTER — Ambulatory Visit (INDEPENDENT_AMBULATORY_CARE_PROVIDER_SITE_OTHER): Payer: 59 | Admitting: Family Medicine

## 2019-04-06 ENCOUNTER — Other Ambulatory Visit: Payer: Self-pay

## 2019-04-06 DIAGNOSIS — U071 COVID-19: Secondary | ICD-10-CM

## 2019-04-06 MED ORDER — ALBUTEROL SULFATE HFA 108 (90 BASE) MCG/ACT IN AERS: 2.0000 | INHALATION_SPRAY | RESPIRATORY_TRACT | 1 refills | Status: DC | PRN

## 2019-04-06 MED ORDER — TRIAMCINOLONE ACETONIDE 0.1 % EX CREA: 1.0000 "application " | TOPICAL_CREAM | Freq: Two times a day (BID) | CUTANEOUS | 2 refills | Status: DC

## 2019-04-06 NOTE — Patient Instructions (Signed)
Person Under Monitoring Name: Debra Stuart  Location: [address] [place] [address]   CORONAVIRUS DISEASE 2019 (COVID-19) Guidance for Persons Under Investigation You are being tested for the virus that causes coronavirus disease 2019 (COVID-19). Public health actions are necessary to ensure protection of your health and the health of others, and to prevent further spread of infection. COVID-19 is caused by a virus that can cause symptoms, such as fever, cough, and shortness of breath. The primary transmission from person to person is by coughing or sneezing. On March 26, 2018, the World Health Organization announced a [employer] Emergency of International Concern and on March 27, 2018 the U.S. Department of Health and Human Services declared a public health emergency. If the virus that causesCOVID-19 spreads in the community, it could have severe public health consequences.  As a person under investigation for COVID-19, the [employer] of Health and [employer], Division of [employer] advises you to adhere to the following guidance until your test results are reported to you. If your test result is positive, you will receive additional information from your provider and your local health department at that time.   Remain at home until you are cleared by your health provider or public health authorities.   Keep a log of visitors to your home using the form provided. Any visitors to your home must be aware of your isolation status.  If you plan to move to a new address or leave the county, notify the local health department in your county.  Call a doctor or seek care if you have an urgent medical need. Before seeking medical care, call ahead and get instructions from the provider before arriving at the medical office, clinic or hospital. Notify them that you are being tested for the virus that causes COVID-19 so arrangements can be made, as necessary, to prevent  transmission to others in the healthcare setting. Next, notify the local health department in your county.  If a medical emergency arises and you need to call 911, inform the first responders that you are being tested for the virus that causes COVID-19. Next, notify the local health department in your county.  Adhere to all guidance set forth by the [facility] Division of [employer] for [facility] of patients that is based on guidance from the Center for Disease Control and Prevention with suspected or confirmed COVID-19. It is provided with this guidance for Persons Under Investigation.  Your health and the health of our community are our top priorities. Public Health officials remain available to provide assistance and counseling to you about COVID-19 and compliance with this guidance.  Provider: ____________________________________________________________ Date: ______/_____/_________  By signing below, you acknowledge that you have read and agree to comply with this Guidance for Persons Under Investigation. ______________________________________________________________ Date: ______/_____/_________  WHO DO I CALL? You can find a list of local health departments here: [email or web address] Health Department: ____________________________________________________________________ Contact Name: ________________________________________________________________________ Telephone: ___________________________________________________________________________  Nedra Hai, Division of Public Health, Communicable Disease Branch COVID-19 Guidance for Persons Under Investigation March 7, 2020Patient was educated regarding the warning signs.  Warning signs include trouble breathing, passing out or near syncope, persistent pain or pressure in the chest, new confusion or difficulty to arouse, bluish lips, unable to keep liquids down.  If emergency  symptoms are occurring then-if patient feels they are having medical emergency they are to call 911.  Otherwise they need to go to the emergency department.  For  milder cases home care is the best approach.  Home care minimizes exposure of the infected patient to others.  It is wise to self isolate. 10 ways to manage respiratory symptoms at home-per CDC guidelines  #1 stay at home-stay home from work and away from other public places.  If having to go out it is important to avoid public transportation ride sharing etc. #2 monitor your symptoms carefully-if symptoms worsen or show signs of emergent issues ER care may be necessary.  If more routine issues may call office for advice. #3 stay rested and stay hydrated. #4 if medical emergencies call 911 and notify dispatch personnel that you may have [phone] #5 cover your cough and sneezes #6 wash your hands often with soap and water for at least 20 seconds or use alcohol based hand sanitizer that contains at least 60% alcohol #7 as much as possible stay in a specific room at your home and stay away from other people in your home.  If possible please use separate bathroom.  If you have to be around other people in or outside of the home wear a facemask. #8 avoid sharing personal items-such as dishes, towels, bedding, do not drink after each other #9 clean all surfaces that are touched often like counters tabletops doorknobs use household cleaning sprays or wipes according to the label instructions #10 this illness can vary from person to person but most people over several days will gradually improve  Home self isolation guidelines CDC recommendations  Patients with symptoms consistent with Covid 19 should stay in self-isolation until: At least 3 days have passed since recovery-this is defined as no fevers (without medications), improvement in respiratory symptoms, and at least 10 days have passed since the symptoms first appeared.  Additional  information available at http://www.wolf.info/ and  also www.C19check.com is a good website that educates when a person should consider going to the ER versus home care.  The patient would just have to put in various information and it will automatically give advice.  Should the patient need further advice from [place] to call.

## 2019-04-06 NOTE — Progress Notes (Signed)
Subjective:  Initially was a virtual patient but because of the difficulty of her symptomatology she was brought to the office for further evaluation She was seen outside at the outside facility for safety purposes  Patient ID: Debra Stuart, female    DOB: 1995/04/23, 24 y.o.   MRN: [id number]  HPI3 days ago pt was feeling off and went had a rapid test done in [place] yesterday and it came back positive. Now having a headache, sob - feels like when she breaths in their is not enough room for the air, congestion, achy. Tried dayquil. The patient relates a lot of aching pain discomfort denies high fever chills sweats does relate feeling a little short winded when she moves around denies any injury.  Denies productive coughing. Would like refill of triamcinolone cream for a rash on her feet that comes and goes.  Patient states her rapid test was positive but she did a backup hoping it would be negative Virtual Visit via Telephone Note  I connected with Lurdes Haltiwanger on 04/06/19 at  2:00 PM EST by telephone and verified that I am speaking with the correct person using two identifiers.  Location: Patient: home Provider: office   I discussed the limitations, risks, security and privacy concerns of performing an evaluation and management service by telephone and the availability of in person appointments. I also discussed with the patient that there may be a patient responsible charge related to this service. The patient expressed understanding and agreed to proceed.   History of Present Illness:    Observations/Objective:   Assessment and Plan:   Follow Up Instructions:    I discussed the assessment and treatment plan with the patient. The patient was provided an opportunity to ask questions and all were answered. The patient agreed with the plan and demonstrated an understanding of the instructions.   The patient was advised to call back or seek an in-person evaluation if the symptoms  worsen or if the condition fails to improve as anticipated.  I provided 20 minutes of non-face-to-face time during this encounter.      Review of Systems  Constitutional: Positive for fatigue. Negative for activity change, appetite change and fever.  HENT: Positive for congestion and rhinorrhea. Negative for ear pain.   Eyes: Negative for discharge.  Respiratory: Positive for cough. Negative for shortness of breath and wheezing.   Cardiovascular: Negative for chest pain and leg swelling.  Gastrointestinal: Negative for abdominal pain and diarrhea.  Endocrine: Negative for polydipsia and polyphagia.  Musculoskeletal: Positive for myalgias. Negative for back pain.  Skin: Negative for color change.  Neurological: Negative for dizziness and weakness.  Psychiatric/Behavioral: Negative for behavioral problems and confusion.       Objective:   Physical Exam Vitals and nursing note reviewed.  Constitutional:      Appearance: She is well-developed.  HENT:     Head: Normocephalic.     Nose: Nose normal.     Mouth/Throat:     Pharynx: No oropharyngeal exudate.  Cardiovascular:     Rate and Rhythm: Normal rate.     Heart sounds: Normal heart sounds. No murmur.  Pulmonary:     Effort: Pulmonary effort is normal.     Breath sounds: Normal breath sounds. No wheezing.  Musculoskeletal:     Cervical back: Neck supple.  Lymphadenopathy:     Cervical: No cervical adenopathy.  Skin:    General: Skin is warm and dry.    Patient's O2 saturation 96% temperature 98  Assessment & Plan:  Covid infection I told the patient regardless what her second test shows she should treat herself as a Covid infection.  She should self isolate for at least 10 days.  If she has any problems or questions to call us. No sign of pneumonia currently Warning signs discussed in detail Also information printed and given to the patient Patient is instructed to self isolate for at least 10 days warning  signs on when to go to ER or urgent care were reviewed with her.  X-rays lab work not indicated currently May take supplemental zinc vitamin D and vitamin C

## 2019-04-13 ENCOUNTER — Encounter: Payer: Self-pay | Admitting: Family Medicine

## 2019-04-29 ENCOUNTER — Other Ambulatory Visit: Payer: Self-pay

## 2019-04-29 ENCOUNTER — Ambulatory Visit (INDEPENDENT_AMBULATORY_CARE_PROVIDER_SITE_OTHER): Payer: 59 | Admitting: Family Medicine

## 2019-04-29 DIAGNOSIS — M272 Inflammatory conditions of jaws: Secondary | ICD-10-CM | POA: Diagnosis not present

## 2019-04-29 MED ORDER — HYDROCODONE-ACETAMINOPHEN 5-325 MG PO TABS: ORAL_TABLET | ORAL | 0 refills | Status: DC

## 2019-04-29 NOTE — Progress Notes (Signed)
   Subjective:  Audio plus video  Patient ID: Debra Stuart, female    DOB: June 22, 1995, 22 y.o.   MRN: [id number]  HPI Pt is having pain on right side of face. Going on since Sunday night. Pt went to Urgent Care on Monday. No swelling that patient can tell. Pt was prescribed Ibuprofen 800 mg but no relief. Pt has been using ice also.   Virtual Visit via Video Note  I connected with Josefina Rynders on 04/29/19 at  8:30 AM EST by a video enabled telemedicine application and verified that I am speaking with the correct person using two identifiers.  Location: Patient: home Provider: office   I discussed the limitations of evaluation and management by telemedicine and the availability of in person appointments. The patient expressed understanding and agreed to proceed.  History of Present Illness:    Observations/Objective:   Assessment and Plan:   Follow Up Instructions:    I discussed the assessment and treatment plan with the patient. The patient was provided an opportunity to ask questions and all were answered. The patient agreed with the plan and demonstrated an understanding of the instructions.   The patient was advised to call back or seek an in-person evaluation if the symptoms worsen or if the condition fails to improve as anticipated.  I provided 20 minutes of non-face-to-face time during this encounter.   Therefore urgent care note was reviewed.  Patient having ongoing challenges.  Right-sided facial pain radiating to right jaw.  Emanating from upper jaw to specific tooth  Admits to no dental care in recent years  Urgent care visit revealed poor dentition  No fever no chills no vomiting   Review of Systems See above    Objective:   Physical Exam   Virtual     Assessment & Plan:  Impression odontogenic pain.  On Augmentin.  On ibuprofen.  Still substantial breakthrough pain.  Discussed.  Will cover with narcotic pain medication.  Proper use discussed  warning signs discussed.  Absolute need to see a dentist ASAP discussed

## 2019-05-26 ENCOUNTER — Ambulatory Visit (INDEPENDENT_AMBULATORY_CARE_PROVIDER_SITE_OTHER): Payer: 59 | Admitting: Adult Health

## 2019-05-26 ENCOUNTER — Other Ambulatory Visit: Payer: Self-pay

## 2019-05-26 ENCOUNTER — Encounter: Payer: Self-pay | Admitting: Adult Health

## 2019-05-26 VITALS — BP 117/81 | HR 97 | Ht 63.0 in | Wt 246.5 lb

## 2019-05-26 DIAGNOSIS — Z975 Presence of (intrauterine) contraceptive device: Secondary | ICD-10-CM | POA: Insufficient documentation

## 2019-05-26 DIAGNOSIS — N760 Acute vaginitis: Secondary | ICD-10-CM | POA: Insufficient documentation

## 2019-05-26 DIAGNOSIS — B9689 Other specified bacterial agents as the cause of diseases classified elsewhere: Secondary | ICD-10-CM | POA: Insufficient documentation

## 2019-05-26 DIAGNOSIS — F329 Major depressive disorder, single episode, unspecified: Secondary | ICD-10-CM

## 2019-05-26 DIAGNOSIS — N898 Other specified noninflammatory disorders of vagina: Secondary | ICD-10-CM | POA: Insufficient documentation

## 2019-05-26 DIAGNOSIS — F32A Depression, unspecified: Secondary | ICD-10-CM

## 2019-05-26 LAB — POCT WET PREP (WET MOUNT)
Clue Cells Wet Prep Whiff POC: POSITIVE
WBC, Wet Prep HPF POC: POSITIVE

## 2019-05-26 MED ORDER — METRONIDAZOLE 500 MG PO TABS: 500.0000 mg | ORAL_TABLET | Freq: Two times a day (BID) | ORAL | 0 refills | Status: DC

## 2019-05-26 NOTE — Patient Instructions (Signed)
Bacterial Vaginosis  Bacterial vaginosis is a vaginal infection that occurs when the normal balance of bacteria in the vagina is disrupted. It results from an overgrowth of certain bacteria. This is the most common vaginal infection among women ages 15-44. Because bacterial vaginosis increases your risk for STIs (sexually transmitted infections), getting treated can help reduce your risk for chlamydia, gonorrhea, herpes, and HIV (human immunodeficiency virus). Treatment is also important for preventing complications in pregnant women, because this condition can cause an early (premature) delivery. What are the causes? This condition is caused by an increase in harmful bacteria that are normally present in small amounts in the vagina. However, the reason that the condition develops is not fully understood. What increases the risk? The following factors may make you more likely to develop this condition:  Having a new sexual partner or multiple sexual partners.  Having unprotected sex.  Douching.  Having an intrauterine device (IUD).  Smoking.  Drug and alcohol abuse.  Taking certain antibiotic medicines.  Being pregnant. You cannot get bacterial vaginosis from toilet seats, bedding, swimming pools, or contact with objects around you. What are the signs or symptoms? Symptoms of this condition include:  Grey or white vaginal discharge. The discharge can also be watery or foamy.  A fish-like odor with discharge, especially after sexual intercourse or during menstruation.  Itching in and around the vagina.  Burning or pain with urination. Some women with bacterial vaginosis have no signs or symptoms. How is this diagnosed? This condition is diagnosed based on:  Your medical history.  A physical exam of the vagina.  Testing a sample of vaginal fluid under a microscope to look for a large amount of bad bacteria or abnormal cells. Your health care provider may use a cotton swab or  a small wooden spatula to collect the sample. How is this treated? This condition is treated with antibiotics. These may be given as a pill, a vaginal cream, or a medicine that is put into the vagina (suppository). If the condition comes back after treatment, a second round of antibiotics may be needed. Follow these instructions at home: Medicines  Take over-the-counter and prescription medicines only as told by your health care provider.  Take or use your antibiotic as told by your health care provider. Do not stop taking or using the antibiotic even if you start to feel better. General instructions  If you have a female sexual partner, tell her that you have a vaginal infection. She should see her health care provider and be treated if she has symptoms. If you have a female sexual partner, he does not need treatment.  During treatment: ? Avoid sexual activity until you finish treatment. ? Do not douche. ? Avoid alcohol as directed by your health care provider. ? Avoid breastfeeding as directed by your health care provider.  Drink enough water and fluids to keep your urine clear or pale yellow.  Keep the area around your vagina and rectum clean. ? Wash the area daily with warm water. ? Wipe yourself from front to back after using the toilet.  Keep all follow-up visits as told by your health care provider. This is important. How is this prevented?  Do not douche.  Wash the outside of your vagina with warm water only.  Use protection when having sex. This includes latex condoms and dental dams.  Limit how many sexual partners you have. To help prevent bacterial vaginosis, it is best to have sex with just one partner (  monogamous).  Make sure you and your sexual partner are tested for STIs.  Wear cotton or cotton-lined underwear.  Avoid wearing tight pants and pantyhose, especially during summer.  Limit the amount of alcohol that you drink.  Do not use any products that contain  nicotine or tobacco, such as cigarettes and e-cigarettes. If you need help quitting, ask your health care provider.  Do not use illegal drugs. Where to find more information  Centers for Disease Control and Prevention: www.cdc.gov/std  American Sexual Health Association (ASHA): www.ashastd.org  U.S. Department of Health and Human Services, Office on Women's Health: www.womenshealth.gov/ or https://www.womenshealth.gov/a-z-topics/bacterial-vaginosis Contact a health care provider if:  Your symptoms do not improve, even after treatment.  You have more discharge or pain when urinating.  You have a fever.  You have pain in your abdomen.  You have pain during sex.  You have vaginal bleeding between periods. Summary  Bacterial vaginosis is a vaginal infection that occurs when the normal balance of bacteria in the vagina is disrupted.  Because bacterial vaginosis increases your risk for STIs (sexually transmitted infections), getting treated can help reduce your risk for chlamydia, gonorrhea, herpes, and HIV (human immunodeficiency virus). Treatment is also important for preventing complications in pregnant women, because the condition can cause an early (premature) delivery.  This condition is treated with antibiotic medicines. These may be given as a pill, a vaginal cream, or a medicine that is put into the vagina (suppository). This information is not intended to replace advice given to you by your health care provider. Make sure you discuss any questions you have with your health care provider. Document Revised: 01/24/2017 Document Reviewed: 10/28/2015 Elsevier Patient Education  2020 Elsevier Inc.  

## 2019-05-26 NOTE — Progress Notes (Signed)
Patient ID: Debra Stuart, female   DOB: 1995/08/16, 24 y.o.   MRN: [id number] History of Present Illness: Debra Stuart is a 24 year old white female, single, G0P0, in complaining of vaginal itching and burning for over a week. PCP is Dr Lubertha South.   Current Medications, Allergies, Past Medical History, Past Surgical History, Family History and Social History were reviewed in [employer] record.     Review of Systems:  Vaginal itching and selling for 1-2 weeks  Tried monistat I burned and stopped Has used new soap  No new sex  partners  She has IUD was inserted 12/30/17,in [place] at [facility]   Physical Exam:BP 117/81 (BP Location: Left Arm, Patient Position: Sitting, Cuff Size: Large)   Pulse 97   Ht 5\' 3"  (1.6 m)   Wt 246 lb 8 oz (111.8 kg)   BMI 43.67 kg/m  General:  Well developed, well nourished, no acute distress Skin:  Warm and dry Neck:  Midline trachea, normal thyroid, good ROM, no lymphadenopathy Lungs; Clear to auscultation bilaterally Cardiovascular: Regular rate and rhythm Pelvic:  External genitalia is normal in appearance, has linear scratch left labial base.  The vagina is irritated, with tan discharge with slight odor. Urethra has no lesions or masses. The cervix is smooth, +IUD strings at os, nuswab obtained. Uterus is felt to be normal size, shape, and contour.  No adnexal masses or tenderness noted.Bladder is non tender, no masses felt. Wet prep:+WBCs and clue cells Extremities/musculoskeletal:  No swelling or varicosities noted, no clubbing or cyanosis Psych:  No mood changes, alert and cooperative,seems happy Fall risk is low PHQ 9 score is 18, no SI, wants counseling, will refer to [facility].she says has seen [facility] and stopped because they just wanted to give her meds Alcohol audit is 2 Examination chaperoned by [facility] LPN  Impression and plan 1. Vaginal itching Nuswab sent  2. Vaginal discharge Nuswab sent  3. BV  (bacterial vaginosis) Will rx flagyl Meds ordered this encounter  Medications  . metroNIDAZOLE (FLAGYL) 500 MG tablet    Sig: Take 1 tablet (500 mg total) by mouth 2 (two) times daily.    Dispense:  14 tablet    Refill:  0    Order Specific Question:   Supervising Provider    Answer:   Malachy Mood H [[id number]]  Review handout on BV Nuswaab sent  Return in 4 weeks for pap and physical   4. IUD (intrauterine device) in place   5. Depression, unspecified depression type [facility]er to [facility]

## 2019-06-01 LAB — NUSWAB VAGINITIS PLUS (VG+)
Candida albicans, NAA: NEGATIVE
Candida glabrata, NAA: NEGATIVE
Chlamydia trachomatis, NAA: NEGATIVE
Neisseria gonorrhoeae, NAA: NEGATIVE
Trich vag by NAA: NEGATIVE

## 2019-06-07 ENCOUNTER — Other Ambulatory Visit: Payer: Self-pay | Admitting: *Deleted

## 2019-06-07 DIAGNOSIS — F32A Depression, unspecified: Secondary | ICD-10-CM

## 2019-06-07 DIAGNOSIS — F329 Major depressive disorder, single episode, unspecified: Secondary | ICD-10-CM

## 2019-06-23 ENCOUNTER — Other Ambulatory Visit: Payer: 59 | Admitting: Adult Health

## 2019-07-16 ENCOUNTER — Other Ambulatory Visit: Payer: Self-pay | Admitting: Family Medicine

## 2019-07-16 DIAGNOSIS — Z0283 Encounter for blood-alcohol and blood-drug test: Secondary | ICD-10-CM

## 2019-07-22 ENCOUNTER — Encounter: Payer: Self-pay | Admitting: Family Medicine

## 2019-07-22 ENCOUNTER — Other Ambulatory Visit: Payer: Self-pay

## 2019-07-22 ENCOUNTER — Ambulatory Visit (INDEPENDENT_AMBULATORY_CARE_PROVIDER_SITE_OTHER): Payer: 59 | Admitting: Family Medicine

## 2019-07-22 VITALS — BP 122/78 | HR 91 | Temp 97.9°F | Ht 63.0 in | Wt 246.4 lb

## 2019-07-22 DIAGNOSIS — L409 Psoriasis, unspecified: Secondary | ICD-10-CM

## 2019-07-22 DIAGNOSIS — B353 Tinea pedis: Secondary | ICD-10-CM

## 2019-07-22 MED ORDER — KETOCONAZOLE 2 % EX CREA: 1.0000 "application " | TOPICAL_CREAM | Freq: Every day | CUTANEOUS | 0 refills | Status: DC

## 2019-07-22 MED ORDER — MOMETASONE FUROATE 0.1 % EX CREA
1.0000 | TOPICAL_CREAM | Freq: Every day | CUTANEOUS | 0 refills | Status: DC
Start: 2019-07-22 — End: 2019-07-29

## 2019-07-22 NOTE — Progress Notes (Deleted)
   Patient ID: Debra Stuart, female    DOB: 10/12/1995, 24 y.o.   MRN: [id number]   No chief complaint on file.  Subjective:    HPI   Medical History Debra Stuart has a past medical history of Chronic knee pain, Chronic mental illness, and Vision abnormalities.   Outpatient Encounter Medications as of 07/22/2019  Medication Sig  . albuterol (VENTOLIN HFA) 108 (90 Base) MCG/ACT inhaler Inhale 2 puffs into the lungs every 4 (four) hours as needed for wheezing.  Debra Stuart Kitchen HYDROcodone-acetaminophen (NORCO/VICODIN) 5-325 MG tablet Take one tablet po q 4-6 hours prn pain (Patient not taking: Reported on 05/26/2019)  . Ibuprofen (ADVIL) 200 MG CAPS Take by mouth.  Debra Stuart Kitchen ketoconazole (NIZORAL) 2 % cream Apply 1 application topically daily. Bilateral foot rash  . levonorgestrel (MIRENA) 20 MCG/24HR IUD 1 each by Intrauterine route once.  . metroNIDAZOLE (FLAGYL) 500 MG tablet Take 1 tablet (500 mg total) by mouth 2 (two) times daily.  . mometasone (ELOCON) 0.1 % cream Apply 1 application topically daily. To bilateral foot rash   No facility-administered encounter medications on file as of 07/22/2019.     Review of Systems   Vitals There were no vitals taken for this visit.  Objective:   Physical Exam   Assessment and Plan   There are no diagnoses linked to this encounter.     Hamid Brookens Orie Fisherman, DO 07/22/2019

## 2019-07-22 NOTE — Progress Notes (Signed)
Patient ID: Debra Stuart, female    DOB: 11/06/1995, 24 y.o.   MRN: [id number]   Chief Complaint  Patient presents with  . Rash    feet for 6 months   Subjective:    HPI Pt having large rash on the top of the rt foot and small itching and rash on bilateral big toes.  Has started over 6 months ago. Pt has been wearing sandals and flip flops for last 6 months due to rash.  Wasn't sure if it was fungal rash.  Her father has h/o psoriasis.  Used otc HC cream and also tried a prescription steroid cream 2x but didn't get much relief.  Was on prednisone 1-2 months ago and it helped clear it up.  Some times sees the rash on the rt lateral ankle and on bilateral elbows.   Medical History Graclyn has a past medical history of Chronic knee pain, Chronic mental illness, and Vision abnormalities.   Outpatient Encounter Medications as of 07/22/2019  Medication Sig  . albuterol (VENTOLIN HFA) 108 (90 Base) MCG/ACT inhaler Inhale 2 puffs into the lungs every 4 (four) hours as needed for wheezing.  Marland Kitchen HYDROcodone-acetaminophen (NORCO/VICODIN) 5-325 MG tablet Take one tablet po q 4-6 hours prn pain (Patient not taking: Reported on 05/26/2019)  . Ibuprofen (ADVIL) 200 MG CAPS Take by mouth.  Marland Kitchen ketoconazole (NIZORAL) 2 % cream Apply 1 application topically daily. Bilateral foot rash  . levonorgestrel (MIRENA) 20 MCG/24HR IUD 1 each by Intrauterine route once.  . metroNIDAZOLE (FLAGYL) 500 MG tablet Take 1 tablet (500 mg total) by mouth 2 (two) times daily.  . mometasone (ELOCON) 0.1 % cream Apply 1 application topically daily. To bilateral foot rash   No facility-administered encounter medications on file as of 07/22/2019.     Review of Systems  Constitutional: Negative for chills and fever.  Musculoskeletal: Negative for arthralgias, joint swelling and myalgias.  Skin: Positive for rash. Negative for wound.     Vitals BP 122/78   Pulse 91   Temp 97.9 F (36.6 C) (Oral)   Ht 5\' 3"  (1.6 m)    Wt 246 lb 6.4 oz (111.8 kg)   SpO2 100%   BMI 43.65 kg/m   Objective:   Physical Exam Constitutional:      General: She is not in acute distress.    Appearance: Normal appearance. She is not ill-appearing.  Skin:    General: Skin is warm and dry.     Findings: Rash present.     Comments: +RT dorsal foot, large silvery plaque on erythematous base about 5 inch x 4 inches.  Bilateral big toes- erythematous patches, no papules or plaques.    Neurological:     General: No focal deficit present.     Mental Status: She is alert and oriented to person, place, and time.  Psychiatric:        Mood and Affect: Mood normal.        Behavior: Behavior normal.     Assessment and Plan   1. Psoriasis - mometasone (ELOCON) 0.1 % cream; Apply 1 application topically daily. To bilateral foot rash  Dispense: 50 g; Refill: 0  2. Tinea pedis of both feet - ketoconazole (NIZORAL) 2 % cream; Apply 1 application topically daily. Bilateral foot rash  Dispense: 60 g; Refill: 0   Advising to use both creams on feet daily for 2 weeks and call or rto if not improving. Change socks frequently and wear breathable shoes.  Pt in agreement.   F/u prn.

## 2019-07-23 ENCOUNTER — Encounter: Payer: Self-pay | Admitting: Family Medicine

## 2019-07-25 LAB — TOXASSURE SELECT 13 (MW), URINE

## 2019-07-29 ENCOUNTER — Other Ambulatory Visit (HOSPITAL_COMMUNITY)
Admission: RE | Admit: 2019-07-29 | Discharge: 2019-07-29 | Disposition: A | Discharge status: Home / Self Care | Payer: 59 | Source: Ambulatory Visit | Attending: Adult Health | Admitting: Adult Health

## 2019-07-29 ENCOUNTER — Ambulatory Visit (INDEPENDENT_AMBULATORY_CARE_PROVIDER_SITE_OTHER): Payer: 59 | Admitting: Adult Health

## 2019-07-29 ENCOUNTER — Encounter: Payer: Self-pay | Admitting: Adult Health

## 2019-07-29 VITALS — BP 130/80 | HR 78 | Ht 63.0 in | Wt 246.0 lb

## 2019-07-29 DIAGNOSIS — Z975 Presence of (intrauterine) contraceptive device: Secondary | ICD-10-CM | POA: Diagnosis not present

## 2019-07-29 DIAGNOSIS — Z01419 Encounter for gynecological examination (general) (routine) without abnormal findings: Secondary | ICD-10-CM | POA: Insufficient documentation

## 2019-07-29 DIAGNOSIS — Z1272 Encounter for screening for malignant neoplasm of vagina: Secondary | ICD-10-CM

## 2019-07-29 DIAGNOSIS — F32A Depression, unspecified: Secondary | ICD-10-CM

## 2019-07-29 DIAGNOSIS — F329 Major depressive disorder, single episode, unspecified: Secondary | ICD-10-CM | POA: Diagnosis not present

## 2019-07-29 NOTE — Progress Notes (Signed)
Patient ID: Spirit Wernli, female   DOB: [birth year], 24 y.o.   MRN: [id number] History of Present Illness:  Debra Stuart is a 24 year old white female, single, G0P0, in for well woman gyn exam and first pap. She has liletta IUD. PCP is Dr Lubertha South  Current Medications, Allergies, Past Medical History, Past Surgical History, Family History and Social History were reviewed in [employer] electronic medical record.     Review of Systems: Patient denies any headaches, hearing loss, fatigue, blurred vision, shortness of breath, chest pain, abdominal pain, problems with bowel movements(has mild constipation she says), urination. No joint pain or mood swings. Has some discomfort with sex at times and feels bloated at night some. Seems more depressed since having COVID.  Physical Exam:BP 130/80 (BP Location: Left Arm, Patient Position: Sitting, Cuff Size: Normal)   Pulse 78   Ht 5\' 3"  (1.6 m)   Wt 246 lb (111.6 kg)   BMI 43.58 kg/m  General:  Well developed, well nourished, no acute distress Skin:  Warm and dry,has tattoos Neck:  Midline trachea, normal thyroid, good ROM, no lymphadenopathy Lungs; Clear to auscultation bilaterally Breast:  No dominant palpable mass, retraction, or nipple discharge Cardiovascular: Regular rate and rhythm Abdomen:  Soft, non tender, no hepatosplenomegaly Pelvic:  External genitalia is normal in appearance, no lesions.  The vagina is normal in appearance. Urethra has no lesions or masses. The cervix is everted at os, +IUDstrings seen, pap with GC/CHL performed.  Uterus is felt to be normal size, shape, and contour.  No adnexal masses or tenderness noted.Bladder is non tender, no masses felt. Extremities/musculoskeletal:  No swelling or varicosities noted, no clubbing or cyanosis Psych:  No mood changes, alert and cooperative,seems happy PHQ 9 score is 19, [facility] did not take her insurance, given number for in [place], she wants to talk does  not want meds.  Examination chaperoned by [place] LPN  Impression and Plan: 1. Encounter for gynecological examination with Papanicolaou smear of cervix Pap sent Physical in 1 year Pap in 3 if normal  Try different positions with sex and increased foreplay   2. IUD (intrauterine device) in place   3. Depression, unspecified depression type Number given for [facility] [place]

## 2019-08-03 LAB — CYTOLOGY - PAP
Chlamydia: NEGATIVE
Comment: NEGATIVE
Comment: NORMAL
Neisseria Gonorrhea: NEGATIVE

## 2019-08-04 ENCOUNTER — Encounter: Payer: Self-pay | Admitting: Adult Health

## 2019-08-04 DIAGNOSIS — R87619 Unspecified abnormal cytological findings in specimens from cervix uteri: Secondary | ICD-10-CM

## 2019-08-04 HISTORY — DX: Unspecified abnormal cytological findings in specimens from cervix uteri: R87.619

## 2019-08-09 ENCOUNTER — Other Ambulatory Visit: Payer: Self-pay

## 2019-08-09 ENCOUNTER — Ambulatory Visit: Payer: 59 | Admitting: Family Medicine

## 2019-08-09 ENCOUNTER — Encounter: Payer: Self-pay | Admitting: Family Medicine

## 2019-08-09 VITALS — BP 114/72 | HR 84 | Temp 97.6°F | Ht 63.0 in | Wt 251.0 lb

## 2019-08-09 DIAGNOSIS — R519 Headache, unspecified: Secondary | ICD-10-CM

## 2019-08-09 DIAGNOSIS — K029 Dental caries, unspecified: Secondary | ICD-10-CM | POA: Diagnosis not present

## 2019-08-09 MED ORDER — AMOXICILLIN 500 MG PO CAPS
500.0000 mg | ORAL_CAPSULE | Freq: Two times a day (BID) | ORAL | 0 refills | Status: DC
Start: 2019-08-09 — End: 2019-10-06

## 2019-08-09 NOTE — Progress Notes (Signed)
   Patient ID: Debra Stuart, female    DOB: Jul 14, 1995, 24 y.o.   MRN: [id number]   Chief Complaint  Patient presents with  . Dental Pain   Subjective:    HPI  -concern of dental and gum pain. Pain and swelling on rt side of face for 3 days. Pain in last 2 molars on right upper. No fever.  Has had this a few months ago and seen at urgent care and given amoxicillin and it resolved.  Hasn't seen dentist due to not having dental insurance.   Medical History Debra Stuart has a past medical history of Abnormal Pap smear of cervix (08/04/2019), Chronic knee pain, Chronic mental illness, and Vision abnormalities.   Outpatient Encounter Medications as of 08/09/2019  Medication Sig  . albuterol (VENTOLIN HFA) 108 (90 Base) MCG/ACT inhaler Inhale 2 puffs into the lungs every 4 (four) hours as needed for wheezing.  . Ibuprofen (ADVIL) 200 MG CAPS Take by mouth.  Marland Kitchen ketoconazole (NIZORAL) 2 % cream Apply 1 application topically daily. Bilateral foot rash  . levonorgestrel (LILETTA, 52 MG,) 19.5 MCG/DAY IUD IUD 1 each by Intrauterine route once.  . Melatonin 10 MG CAPS Take by mouth.  Marland Kitchen amoxicillin (AMOXIL) 500 MG capsule Take 1 capsule (500 mg total) by mouth 2 (two) times daily.   No facility-administered encounter medications on file as of 08/09/2019.     Review of Systems  Constitutional: Negative for chills and fever.  HENT: Positive for facial swelling. Negative for mouth sores, sinus pressure, sneezing, sore throat and trouble swallowing.        +tooth pain.  Respiratory: Negative for cough.   Skin: Negative for rash.     Vitals BP 114/72   Pulse 84   Temp 97.6 F (36.4 C)   Ht 5\' 3"  (1.6 m)   Wt 251 lb (113.9 kg)   BMI 44.46 kg/m   Objective:   Physical Exam Vitals and nursing note reviewed.  Constitutional:      General: She is not in acute distress.    Appearance: Normal appearance.  HENT:     Mouth/Throat:     Mouth: Mucous membranes are moist.     Dentition: Normal  dentition. No gingival swelling, dental abscesses or gum lesions.     Pharynx: Oropharynx is clear. No oropharyngeal exudate or posterior oropharyngeal erythema.      Comments: No gingival swelling.  +ttp over rt buccal mucosa, no abscess. No facial swelling or erythema.  Musculoskeletal:     Cervical back: Normal range of motion. No erythema.  Lymphadenopathy:     Cervical: No cervical adenopathy.  Neurological:     Mental Status: She is alert.      Assessment and Plan   1. Pain due to dental caries - amoxicillin (AMOXIL) 500 MG capsule; Take 1 capsule (500 mg total) by mouth 2 (two) times daily.  Dispense: 20 capsule; Refill: 0  2. Dental caries, unspecified - amoxicillin (AMOXIL) 500 MG capsule; Take 1 capsule (500 mg total) by mouth 2 (two) times daily.  Dispense: 20 capsule; Refill: 0   Advising take ibuprofen 60-800mg  tid with food.  Can add 500mg  tylenol tid prn. Take amoxicillin. Gave list of 9 clinics for low cost dental clinic or free clinic in area.  F/u prn.

## 2019-09-27 ENCOUNTER — Telehealth: Payer: Self-pay | Admitting: *Deleted

## 2019-09-27 NOTE — Telephone Encounter (Addendum)
Patient called with c/o sharp lower left side pain and heartburn. Patient also stated she was having extreme exhaustion and had chest pain and dyspnea walking across campus today. Advised patient to go to ER for evaluation and treatment. Patient verbalized understanding,.

## 2019-10-06 ENCOUNTER — Ambulatory Visit (INDEPENDENT_AMBULATORY_CARE_PROVIDER_SITE_OTHER): Payer: 59 | Admitting: Adult Health

## 2019-10-06 ENCOUNTER — Encounter: Payer: Self-pay | Admitting: Adult Health

## 2019-10-06 VITALS — BP 118/70 | Ht 63.0 in | Wt 253.0 lb

## 2019-10-06 DIAGNOSIS — Z975 Presence of (intrauterine) contraceptive device: Secondary | ICD-10-CM | POA: Diagnosis not present

## 2019-10-06 DIAGNOSIS — R1032 Left lower quadrant pain: Secondary | ICD-10-CM | POA: Diagnosis not present

## 2019-10-06 NOTE — Progress Notes (Signed)
  Subjective:     Patient ID: Debra Stuart, female   DOB: 1995-11-30, 24 y.o.   MRN: [id number]  HPI Debra Stuart is a 24 year old white female,single, G0P0, has IUD, and had LLQ pain about 2 weeks ago, was worse in am and after peeing, none now. She had pap 07/29/19 negative GC/CHL but LSIL, repeat in 1 year as per ASCCP guidelines.  PCP is Dr Ladona Ridgel   Review of Systems Had LLQ pain about 2 weeks ago, worse in am and after peeing,none now Denies any pain with sex and had last week Reviewed past medical,surgical, social and family history. Reviewed medications and allergies.     Objective:   Physical Exam BP 118/70 (BP Location: Left Arm, Patient Position: Sitting, Cuff Size: Large)   Ht 5\' 3"  (1.6 m)   Wt 253 lb (114.8 kg)   BMI 44.82 kg/m    Skin warm and dry.Pelvic: external genitalia is normal in appearance no lesions, vagina: pink with good moisture and rugae,urethra has no lesions or masses noted, cervix:smooth,+IUD strings at os, no CMT, uterus: normal size, shape and contour, non tender, no masses felt, adnexa: no masses,LLQ tenderness noted. Bladder is non tender and no masses felt. Examination chaperoned by LPN  Assessment:     1. LLQ pain Will get GYN Faith Rogue in about a week to assess uterus and ovaries, feel like could have been ovulation cyst    2. IUD (intrauterine device) in place     Plan:     Will get GYN [place] in about a week

## 2019-10-18 ENCOUNTER — Other Ambulatory Visit: Payer: 59

## 2019-10-20 ENCOUNTER — Ambulatory Visit (INDEPENDENT_AMBULATORY_CARE_PROVIDER_SITE_OTHER): Payer: 59

## 2019-10-20 DIAGNOSIS — R1032 Left lower quadrant pain: Secondary | ICD-10-CM | POA: Diagnosis not present

## 2019-10-20 DIAGNOSIS — Z975 Presence of (intrauterine) contraceptive device: Secondary | ICD-10-CM

## 2019-10-20 NOTE — Progress Notes (Signed)
PELVIC US TA/TV: homogeneous retroverted uterus,wnl,IUD is centrally located with in the endometrium,EEC 5.4 mm,normal left ovary,collapsed hemorragic right ovarian cyst with irregular boarders 1.7 x .7 cm,simple right adnexal fluid,ovaries appear mobile,left adnexal discomfort during ultrasound  Chaperone Peggy

## 2019-10-22 ENCOUNTER — Telehealth: Payer: Self-pay | Admitting: Adult Health

## 2019-10-22 NOTE — Telephone Encounter (Signed)
Patient called stating that she seen on her Mychart that her results came in, and she would like a call back from [place] today to explain her results. Please contact pt

## 2019-10-25 NOTE — Telephone Encounter (Signed)
Left message I called, and assumed she saw the notes Amber put in about [place]. The [place] has not been read by MD yet, but it looks to me as the uterus is normal and IUD is in place, has collapsing cyst right ovary and normal left ovary, call me back with any questions

## 2019-12-15 ENCOUNTER — Ambulatory Visit (HOSPITAL_COMMUNITY): Payer: Self-pay | Admitting: Clinical

## 2019-12-23 ENCOUNTER — Ambulatory Visit (HOSPITAL_COMMUNITY): Payer: Self-pay | Admitting: Clinical

## 2019-12-24 ENCOUNTER — Other Ambulatory Visit: Payer: Self-pay

## 2019-12-24 ENCOUNTER — Ambulatory Visit (HOSPITAL_COMMUNITY): Payer: 59 | Admitting: Clinical

## 2020-01-04 ENCOUNTER — Telehealth: Payer: Self-pay

## 2020-01-04 NOTE — Telephone Encounter (Signed)
Needs to be seen again for notes to give referral doctor.   Thx. Dr. Ladona Ridgel

## 2020-01-04 NOTE — Telephone Encounter (Signed)
Patient is requesting a referral dermatologist still having rash on her foot. She was seen 07/22/19 and states its worst. Please advise

## 2020-01-05 NOTE — Telephone Encounter (Signed)
Please call pt and schedule an appt

## 2020-01-28 NOTE — Telephone Encounter (Signed)
Left message to schedule appointment to rash on foot for referral

## 2020-03-08 ENCOUNTER — Telehealth: Payer: Self-pay | Admitting: Family Medicine

## 2020-03-08 ENCOUNTER — Other Ambulatory Visit: Payer: Self-pay

## 2020-03-08 ENCOUNTER — Telehealth (INDEPENDENT_AMBULATORY_CARE_PROVIDER_SITE_OTHER): Payer: 59 | Admitting: Family Medicine

## 2020-03-08 DIAGNOSIS — F419 Anxiety disorder, unspecified: Secondary | ICD-10-CM

## 2020-03-08 MED ORDER — BUSPIRONE HCL 10 MG PO TABS: 10.0000 mg | ORAL_TABLET | Freq: Three times a day (TID) | ORAL | 0 refills | Status: DC | PRN

## 2020-03-08 NOTE — Progress Notes (Signed)
Patient ID: Debra Stuart, female    DOB: Mar 29, 1995, 25 y.o.   MRN: [id number]   Virtual Visit via video Note  I connected with Debra Stuart on 03/08/20 at  2:50 PM EST by a video enabled telemedicine application and verified that I am speaking with the correct person using two identifiers.  Location: Patient: home Provider: office   I discussed the limitations of evaluation and management by telemedicine and the availability of in person appointments. The patient expressed understanding and agreed to proceed.   Chief Complaint  Patient presents with  . Stress   Subjective:    HPI   CC- dec appetite and anxiety.  Pt started a new job about one month ago and has been stressed since. Pt reports stomach problems, nausea and unable to eat much. Just moved in with a friend this past month.  Feeling more stress with working at new job.  Patient stating that she is never hungry or wanting to eat.  She feels that she is having to force herself to eat.  When she does that she does feel better.  No pain with eating.  And is having 4-5 bowel movements per day.  They are small bowel movements or just feeling of gas.  Has been going on about a week after starting the new job.  She has tried Lexapro in the past and she stated that she was not doing well on that medication, and that it caused shaking in her hands.  In the past she also tried Zoloft, venlafaxine.  She felt that she was not having any emotions and felt "like a zombie" on Zoloft.  When she took venlafaxine she did not notice a difference in how she was feeling.  Medical History Debra Stuart has a past medical history of Abnormal Pap smear of cervix (08/04/2019), Chronic knee pain, Chronic mental illness, and Vision abnormalities.   Outpatient Encounter Medications as of 03/08/2020  Medication Sig  . albuterol (VENTOLIN HFA) 108 (90 Base) MCG/ACT inhaler Inhale 2 puffs into the lungs every 4 (four) hours as needed for wheezing.  .  busPIRone (BUSPAR) 10 MG tablet Take 1 tablet (10 mg total) by mouth 3 (three) times daily as needed.  . Ibuprofen 200 MG CAPS Take by mouth.  Marland Kitchen ketoconazole (NIZORAL) 2 % cream Apply 1 application topically daily. Bilateral foot rash  . levonorgestrel (LILETTA, 52 MG,) 19.5 MCG/DAY IUD IUD 1 each by Intrauterine route once.  . [DISCONTINUED] Melatonin 10 MG CAPS Take by mouth.   No facility-administered encounter medications on file as of 03/08/2020.     Review of Systems  Constitutional: Positive for appetite change (decrease). Negative for chills and fever.  HENT: Negative for congestion, rhinorrhea and sore throat.   Respiratory: Negative for cough, shortness of breath and wheezing.   Cardiovascular: Negative for chest pain and leg swelling.  Gastrointestinal: Negative for abdominal pain, diarrhea, nausea and vomiting.  Genitourinary: Negative for dysuria and frequency.  Musculoskeletal: Negative for arthralgias and back pain.  Skin: Negative for rash.  Neurological: Negative for dizziness, weakness and headaches.  Psychiatric/Behavioral: Negative for agitation, confusion, dysphoric mood, self-injury, sleep disturbance and suicidal ideas. The patient is nervous/anxious.      Vitals There were no vitals taken for this visit.  Objective:   Physical Exam  No PE due to video visit. NAD, non labored breathing.  Assessment and Plan   1. Anxiety - busPIRone (BUSPAR) 10 MG tablet; Take 1 tablet (10 mg total) by mouth  3 (three) times daily as needed.  Dispense: 90 tablet; Refill: 0   Will give trial of buspar 10mg  tid prn anxiety. Call if working and if needing refills.  Or call if not improving. Pt in agreement.  F/u 4 wks.   Follow Up Instructions:    I discussed the assessment and treatment plan with the patient. The patient was provided an opportunity to ask questions and all were answered. The patient agreed with the plan and demonstrated an understanding of the  instructions.   The patient was advised to call back or seek an in-person evaluation if the symptoms worsen or if the condition fails to improve as anticipated.  I provided 12 minutes of non-face-to-face time during this encounter.

## 2020-03-08 NOTE — Telephone Encounter (Signed)
[name], varricchio are scheduled for a virtual visit with your provider today.    Just as we do with appointments in the office, we must obtain your consent to participate.  Your consent will be active for this visit and any virtual visit you may have with one of our providers in the next 365 days.    If you have a MyChart account, I can also send a copy of this consent to you electronically.  All virtual visits are billed to your insurance company just like a traditional visit in the office.  As this is a virtual visit, video technology does not allow for your provider to perform a traditional examination.  This may limit your provider's ability to fully assess your condition.  If your provider identifies any concerns that need to be evaluated in person or the need to arrange testing such as labs, EKG, etc, we will make arrangements to do so.    Although advances in technology are sophisticated, we cannot ensure that it will always work on either your end or our end.  If the connection with a video visit is poor, we may have to switch to a telephone visit.  With either a video or telephone visit, we are not always able to ensure that we have a secure connection.   I need to obtain your verbal consent now.   Are you willing to proceed with your visit today?   Debra Stuart has provided verbal consent on 03/08/2020 for a virtual visit (video or telephone).   Marlowe Shores, LPN 08/22/3149  76:16 AM

## 2020-03-28 ENCOUNTER — Telehealth: Payer: Self-pay | Admitting: Family Medicine

## 2020-03-28 ENCOUNTER — Other Ambulatory Visit: Payer: 59

## 2020-03-28 ENCOUNTER — Encounter: Payer: Self-pay | Admitting: Family Medicine

## 2020-03-28 ENCOUNTER — Other Ambulatory Visit: Payer: Self-pay

## 2020-03-28 ENCOUNTER — Telehealth: Payer: Self-pay | Admitting: *Deleted

## 2020-03-28 ENCOUNTER — Telehealth (INDEPENDENT_AMBULATORY_CARE_PROVIDER_SITE_OTHER): Payer: 59 | Admitting: Family Medicine

## 2020-03-28 DIAGNOSIS — U071 COVID-19: Secondary | ICD-10-CM

## 2020-03-28 DIAGNOSIS — Z20822 Contact with and (suspected) exposure to covid-19: Secondary | ICD-10-CM

## 2020-03-28 MED ORDER — ALBUTEROL SULFATE HFA 108 (90 BASE) MCG/ACT IN AERS: 2.0000 | INHALATION_SPRAY | RESPIRATORY_TRACT | 1 refills | Status: AC | PRN

## 2020-03-28 MED ORDER — PREDNISONE 20 MG PO TABS: 40.0000 mg | ORAL_TABLET | Freq: Every day | ORAL | 0 refills | Status: DC

## 2020-03-28 MED ORDER — GUAIFENESIN ER 600 MG PO TB12: 600.0000 mg | ORAL_TABLET | Freq: Two times a day (BID) | ORAL | 0 refills | Status: DC

## 2020-03-28 NOTE — Telephone Encounter (Signed)
Patient has appt for Covid test at [facility] this evening at 5:45pm. Patient would like work note sent thru my chart out 03/27/20-return 04/03/20-she will call back if unable to return to work at that time . Quarantine instructions discussed with patient

## 2020-03-28 NOTE — Telephone Encounter (Signed)
Ms. arnette, driggs are scheduled for a virtual visit with your provider today.    Just as we do with appointments in the office, we must obtain your consent to participate.  Your consent will be active for this visit and any virtual visit you may have with one of our providers in the next 365 days.    If you have a MyChart account, I can also send a copy of this consent to you electronically.  All virtual visits are billed to your insurance company just like a traditional visit in the office.  As this is a virtual visit, video technology does not allow for your provider to perform a traditional examination.  This may limit your provider's ability to fully assess your condition.  If your provider identifies any concerns that need to be evaluated in person or the need to arrange testing such as labs, EKG, etc, we will make arrangements to do so.    Although advances in technology are sophisticated, we cannot ensure that it will always work on either your end or our end.  If the connection with a video visit is poor, we may have to switch to a telephone visit.  With either a video or telephone visit, we are not always able to ensure that we have a secure connection.   I need to obtain your verbal consent now.   Are you willing to proceed with your visit today?   Yareli Carthen has provided verbal consent on 03/28/2020 for a virtual visit (video or telephone).

## 2020-03-28 NOTE — Telephone Encounter (Signed)
Patient needs work note 03/27/20- return to work 04/03/20 sent thru my chart. Thanks

## 2020-03-28 NOTE — Progress Notes (Signed)
Patient ID: Debra Stuart, female    DOB: Nov 22, 1995, 25 y.o.   MRN: [id number]    Virtual Visit via Video Note  I connected with Debra Stuart on 03/28/20 at  1:30 PM EST by a video enabled telemedicine application and verified that I am speaking with the correct person using two identifiers.  Location: Patient: home Provider: office   I discussed the limitations of evaluation and management by telemedicine and the availability of in person appointments. The patient expressed understanding and agreed to proceed.  Chief Complaint  Patient presents with  . Covid Positive    Yesterday- Patient with chills, cough, headache, congestion, body aches, severe back pain and SOB with activity   Subjective:    HPI Patient started feeling ill 1 and 1/2 days ago.  She took her home Covid test and it was positive yesterday.  Has felt feverish and chills.  But her highest temp was 99 degrees.  Has had a deep cough, body aches, shortness of breath with exertion and headache and congestion.  Has not taken any medications over-the-counter yet.  In the past she is required albuterol when she gets sick with upper respiratory illnesses.  However, does not have a diagnosis of asthma in the chart.  She has tried some Advil intermittently for headache.  She does go to work, but they have not required a work note from her yet.  Medical History Debra Stuart has a past medical history of Abnormal Pap smear of cervix (08/04/2019), Chronic knee pain, Chronic mental illness, and Vision abnormalities.   Outpatient Encounter Medications as of 03/28/2020  Medication Sig  . guaiFENesin (MUCINEX) 600 MG 12 hr tablet Take 1 tablet (600 mg total) by mouth 2 (two) times daily.  . predniSONE (DELTASONE) 20 MG tablet Take 2 tablets (40 mg total) by mouth daily with breakfast.  . albuterol (VENTOLIN HFA) 108 (90 Base) MCG/ACT inhaler Inhale 2 puffs into the lungs every 4 (four) hours as needed for wheezing.  . busPIRone (BUSPAR) 10 MG  tablet Take 1 tablet (10 mg total) by mouth 3 (three) times daily as needed.  . Ibuprofen 200 MG CAPS Take by mouth.  Marland Kitchen ketoconazole (NIZORAL) 2 % cream Apply 1 application topically daily. Bilateral foot rash  . levonorgestrel (LILETTA, 52 MG,) 19.5 MCG/DAY IUD IUD 1 each by Intrauterine route once.  . [DISCONTINUED] albuterol (VENTOLIN HFA) 108 (90 Base) MCG/ACT inhaler Inhale 2 puffs into the lungs every 4 (four) hours as needed for wheezing.   No facility-administered encounter medications on file as of 03/28/2020.     Review of Systems  Constitutional: Positive for chills and fever.  HENT: Positive for congestion. Negative for ear pain, rhinorrhea, sinus pressure, sinus pain and sore throat.   Eyes: Negative for pain, discharge and itching.  Respiratory: Positive for cough.   Gastrointestinal: Negative for constipation, diarrhea, nausea and vomiting.  Genitourinary: Negative for difficulty urinating.  Musculoskeletal: Positive for back pain and myalgias.     Vitals There were no vitals taken for this visit.  Objective:   Physical Exam  No PE due to video visit.   NAD, non labored breathing. Coughing occ with talking.  Assessment and Plan   1. COVID-19 - albuterol (VENTOLIN HFA) 108 (90 Base) MCG/ACT inhaler; Inhale 2 puffs into the lungs every 4 (four) hours as needed for wheezing.  Dispense: 18 g; Refill: 1 - predniSONE (DELTASONE) 20 MG tablet; Take 2 tablets (40 mg total) by mouth daily with breakfast.  Dispense: 10  tablet; Refill: 0 - guaiFENesin (MUCINEX) 600 MG 12 hr tablet; Take 1 tablet (600 mg total) by mouth 2 (two) times daily.  Dispense: 60 tablet; Refill: 0   COVID-19 viral illness-we will give patient prednisone albuterol and Mucinex.  Patient to increase fluids.  Patient to use Tylenol ibuprofen for body aches and back pain.  Patient to call or return to office if worsening pain or not improving in the next 2 to 3 days.  Pt needs to quarantine for at least 5  days from the start of symptoms.  Patient to call back when she is ready to go back to work and we can provide work note.  Patient agrees with plan.   F/u prn.  Follow Up Instructions:    I discussed the assessment and treatment plan with the patient. The patient was provided an opportunity to ask questions and all were answered. The patient agreed with the plan and demonstrated an understanding of the instructions.   The patient was advised to call back or seek an in-person evaluation if the symptoms worsen or if the condition fails to improve as anticipated.  I provided 12 minutes of non-face-to-face time during this encounter.

## 2020-03-28 NOTE — Telephone Encounter (Signed)
Ok great, pls send the note pls. Thx. Dr .Ladona Ridgel

## 2020-03-29 LAB — NOVEL CORONAVIRUS, NAA: SARS-CoV-2, NAA: DETECTED — AB

## 2020-03-29 LAB — SARS-COV-2, NAA 2 DAY TAT

## 2020-03-30 ENCOUNTER — Telehealth: Payer: Self-pay | Admitting: *Deleted

## 2020-03-30 NOTE — Telephone Encounter (Signed)
Called to discuss with patient about COVID-19 symptoms and the use of one of the available treatments for those with mild to moderate Covid symptoms and at a high risk of hospitalization.  Pt appears to qualify for outpatient treatment due to co-morbid conditions and/or a member of an at-risk group in accordance with the FDA Emergency Use Authorization.   Patient politely declines.   Symptom onset:  Vaccinated:  Booster?  Immunocompromised?  Qualifiers:     Debra Stuart

## 2020-04-05 ENCOUNTER — Telehealth: Payer: Self-pay | Admitting: Family Medicine

## 2020-04-05 ENCOUNTER — Other Ambulatory Visit: Payer: Self-pay

## 2020-04-05 ENCOUNTER — Telehealth (INDEPENDENT_AMBULATORY_CARE_PROVIDER_SITE_OTHER): Payer: 59 | Admitting: Family Medicine

## 2020-04-05 DIAGNOSIS — F32A Depression, unspecified: Secondary | ICD-10-CM

## 2020-04-05 DIAGNOSIS — S39012D Strain of muscle, fascia and tendon of lower back, subsequent encounter: Secondary | ICD-10-CM

## 2020-04-05 DIAGNOSIS — F419 Anxiety disorder, unspecified: Secondary | ICD-10-CM | POA: Diagnosis not present

## 2020-04-05 MED ORDER — VENLAFAXINE HCL 37.5 MG PO TABS: 37.5000 mg | ORAL_TABLET | Freq: Every day | ORAL | 0 refills | Status: DC

## 2020-04-05 NOTE — Telephone Encounter (Signed)
Ms. Debra Stuart, Debra Stuart are scheduled for a virtual visit with your provider today.    Just as we do with appointments in the office, we must obtain your consent to participate.  Your consent will be active for this visit and any virtual visit you may have with one of our providers in the next 365 days.    If you have a MyChart account, I can also send a copy of this consent to you electronically.  All virtual visits are billed to your insurance company just like a traditional visit in the office.  As this is a virtual visit, video technology does not allow for your provider to perform a traditional examination.  This may limit your provider's ability to fully assess your condition.  If your provider identifies any concerns that need to be evaluated in person or the need to arrange testing such as labs, EKG, etc, we will make arrangements to do so.    Although advances in technology are sophisticated, we cannot ensure that it will always work on either your end or our end.  If the connection with a video visit is poor, we may have to switch to a telephone visit.  With either a video or telephone visit, we are not always able to ensure that we have a secure connection.   I need to obtain your verbal consent now.   Are you willing to proceed with your visit today?   Zamyah Wiesman has provided verbal consent on 04/05/2020 for a virtual visit (video or telephone).   Marlowe Shores, LPN 04/28/3543  62:56 AM

## 2020-04-05 NOTE — Progress Notes (Signed)
Virtual Visit via Telephone Note  I connected with Debra Stuart on 04/05/20 at  1:10 PM EST by telephone and verified that I am speaking with the correct person using two identifiers.  Location: Patient: home Provider: office   I discussed the limitations, risks, security and privacy concerns of performing an evaluation and management service by telephone and the availability of in person appointments. I also discussed with the patient that there may be a patient responsible charge related to this service. The patient expressed understanding and agreed to proceed.      Patient ID: Debra Stuart, female    DOB: 1995/05/07, 25 y.o.   MRN: [id number]   Chief Complaint  Patient presents with  . Anxiety   Subjective:    HPI Pt here to follow up on anxiety-phone visit. Pt placed on Buspar 10 mg TID prn. Pt states she has been taking it but not regularly.   Having covid last week and not remembering. When she does take it helps, but feeling it was too strong. So she is able to cut in half and that is helping better. Tried lexapro and didn't like it. Zoloft on it and was only on small dose. Tried effexor, was having problem with the capsule, and didn't like way it felt in throat with swallowing.  Having some back pain since having covid.  Using heating pad.  Using advil. No new trauma or injury to back. No dysuria or hematuria.   Medical History Debra Stuart has a past medical history of Abnormal Pap smear of cervix (08/04/2019), Chronic knee pain, Chronic mental illness, and Vision abnormalities.   Outpatient Encounter Medications as of 04/05/2020  Medication Sig  . albuterol (VENTOLIN HFA) 108 (90 Base) MCG/ACT inhaler Inhale 2 puffs into the lungs every 4 (four) hours as needed for wheezing.  . busPIRone (BUSPAR) 10 MG tablet Take 1 tablet (10 mg total) by mouth 3 (three) times daily as needed.  . Ibuprofen 200 MG CAPS Take by mouth.  Marland Kitchen ketoconazole (NIZORAL) 2 % cream Apply 1  application topically daily. Bilateral foot rash  . levonorgestrel (LILETTA, 52 MG,) 19.5 MCG/DAY IUD IUD 1 each by Intrauterine route once.  . venlafaxine (EFFEXOR) 37.5 MG tablet Take 1 tablet (37.5 mg total) by mouth daily.  . [DISCONTINUED] guaiFENesin (MUCINEX) 600 MG 12 hr tablet Take 1 tablet (600 mg total) by mouth 2 (two) times daily.  . [DISCONTINUED] predniSONE (DELTASONE) 20 MG tablet Take 2 tablets (40 mg total) by mouth daily with breakfast.   No facility-administered encounter medications on file as of 04/05/2020.     Review of Systems  Constitutional: Negative for chills and fever.  HENT: Negative for congestion, rhinorrhea and sore throat.   Respiratory: Negative for cough, shortness of breath and wheezing.   Cardiovascular: Negative for chest pain and leg swelling.  Gastrointestinal: Negative for abdominal pain, diarrhea, nausea and vomiting.  Genitourinary: Negative for dysuria and frequency.  Musculoskeletal: Positive for back pain. Negative for arthralgias.  Skin: Negative for rash.  Neurological: Negative for dizziness, weakness and headaches.  Psychiatric/Behavioral: Positive for dysphoric mood. Negative for self-injury, sleep disturbance and suicidal ideas. The patient is nervous/anxious.       Vitals There were no vitals taken for this visit.  Objective:   Physical Exam  No PE due to phone visit.  Assessment and Plan   1. Depression, unspecified depression type  2. Anxiety  3. Strain of lumbar region, subsequent encounter    Depression/anxiety- Cont with buspar  at 5mg  tid prn. Adding effexor in tablet to see if will help with depression/anxiety.  Pt having back pain, wanting to just take ibuprofen and tylenol for now.  Using heating pad.  Will call if getting worse.  F/u 4wks for recheck.  Follow Up Instructions:    I discussed the assessment and treatment plan with the patient. The patient was provided an opportunity to ask questions and  all were answered. The patient agreed with the plan and demonstrated an understanding of the instructions.   The patient was advised to call back or seek an in-person evaluation if the symptoms worsen or if the condition fails to improve as anticipated.  I provided 11 minutes of non-face-to-face time during this encounter.

## 2020-05-03 ENCOUNTER — Other Ambulatory Visit: Payer: Self-pay

## 2020-05-03 ENCOUNTER — Other Ambulatory Visit: Payer: Self-pay | Admitting: Family Medicine

## 2020-05-03 ENCOUNTER — Encounter: Payer: Self-pay | Admitting: Family Medicine

## 2020-05-03 ENCOUNTER — Telehealth (INDEPENDENT_AMBULATORY_CARE_PROVIDER_SITE_OTHER): Payer: 59 | Admitting: Family Medicine

## 2020-05-03 DIAGNOSIS — F419 Anxiety disorder, unspecified: Secondary | ICD-10-CM | POA: Diagnosis not present

## 2020-05-03 DIAGNOSIS — R110 Nausea: Secondary | ICD-10-CM

## 2020-05-03 DIAGNOSIS — R634 Abnormal weight loss: Secondary | ICD-10-CM

## 2020-05-03 DIAGNOSIS — F32A Depression, unspecified: Secondary | ICD-10-CM

## 2020-05-03 MED ORDER — ONDANSETRON 4 MG PO TBDP: 4.0000 mg | ORAL_TABLET | Freq: Three times a day (TID) | ORAL | 0 refills | Status: DC | PRN

## 2020-05-03 NOTE — Progress Notes (Signed)
Patient ID: Debra Stuart, female    DOB: Feb 21, 1996, 25 y.o.   MRN: [id number]   Virtual Visit via Telephone Note  I connected with Debra Stuart on 05/03/20 at  2:10 PM EST by telephone and verified that I am speaking with the correct person using two identifiers.  Location: Patient: home Provider: office   I discussed the limitations, risks, security and privacy concerns of performing an evaluation and management service by telephone and the availability of in person appointments. I also discussed with the patient that there may be a patient responsible charge related to this service. The patient expressed understanding and agreed to proceed.  Chief Complaint  Patient presents with  . Anxiety   Subjective:    HPI   follow up on anxiety and depression.  Pt states she is having side effects of effexor. Would wake up gagging when taking that med.   Taking whole buspar 10mg  causing some dizziness.  But taking 5mg  buspar doing well.  Taking it when needing for having panic attack.   effexor- taking at night, dry heaving feeling and nausea.  So noticed if didn't take it, then didn't have the gagging and nausea in am. Stopped taking it.  Having some issues with appetite.  Having nausea and not wanting to eat.  Noticing anxiety worsening this. Can go an entire weekend only eating 1 meal. Almost gagging on the food if forcing it.  Didn't have good experience with behavioral health in [place].   Had a recent job change again.  At times took it when getting to work. Moved back in with parents and helping with the stress of finances. 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   Eating lunch daily.  Some times not eating dinner. Hard to get in breakfast and drinking protein shake instead.  Have lost some weight, in 1 mo- 1.5 mo- lost some weight- now at. 245 lbs today.  253 lbs 8/21.   Medical History Debra Stuart has a past medical history of Abnormal Pap smear of cervix (08/04/2019),  Chronic knee pain, Chronic mental illness, and Vision abnormalities.   Outpatient Encounter Medications as of 05/03/2020  Medication Sig  . albuterol (VENTOLIN HFA) 108 (90 Base) MCG/ACT inhaler Inhale 2 puffs into the lungs every 4 (four) hours as needed for wheezing.  . busPIRone (BUSPAR) 10 MG tablet Take 1 tablet (10 mg total) by mouth 3 (three) times daily as needed.  . Ibuprofen 200 MG CAPS Take by mouth.  . levonorgestrel (LILETTA, 52 MG,) 19.5 MCG/DAY IUD IUD 1 each by Intrauterine route once.  . ondansetron (ZOFRAN-ODT) 4 MG disintegrating tablet Take 1 tablet (4 mg total) by mouth every 8 (eight) hours as needed for nausea or vomiting.  . [DISCONTINUED] ketoconazole (NIZORAL) 2 % cream Apply 1 application topically daily. Bilateral foot rash  . [DISCONTINUED] venlafaxine (EFFEXOR) 37.5 MG tablet TAKE 1 TABLET(37.5 MG) BY MOUTH DAILY   No facility-administered encounter medications on file as of 05/03/2020.     Review of Systems  Constitutional: Positive for appetite change and unexpected weight change. Negative for chills and fever.  HENT: Negative for congestion, rhinorrhea and sore throat.   Respiratory: Negative for cough, shortness of breath and wheezing.   Cardiovascular: Negative for chest pain and leg swelling.  Gastrointestinal: Positive for nausea. Negative for abdominal pain, diarrhea and vomiting.  Genitourinary: Negative for dysuria and frequency.  Musculoskeletal: Negative for arthralgias and back pain.  Skin: Negative for rash.  Neurological: Negative for dizziness, weakness  and headaches.  Psychiatric/Behavioral: Negative for agitation, dysphoric mood, self-injury, sleep disturbance and suicidal ideas. The patient is nervous/anxious.      Vitals There were no vitals taken for this visit.  Objective:   Physical Exam No PE due to phone visit.  Assessment and Plan   1. Depression, unspecified depression type - Ambulatory referral to Psychology  2.  Anxiety - Ambulatory referral to Psychology  3. Nausea  4. Weight loss    Pt went down to 5mg  buspar, instead of 10mg .  Was feeling some dizziness when on 10mg  buspar. Weight loss about 8 lbs from 8/21.  Pt stating weight has gone up and down, feels that she was down 20 lbs at one time, but now down only 8 lbs. Nausea/anxiety- recommending zofran prn and buspar 5mg  in am.  See if will help and enable her to eat more during the day.  Encouraged her to take buspar 5mg  bid to keep anxiety down and help with her appetite.  Pt discontinued effexor, didn't like feeling gagging and nausea in am.  Return in about 3 months (around 08/03/2020) for f/u anxiety.    Follow Up Instructions:    I discussed the assessment and treatment plan with the patient. The patient was provided an opportunity to ask questions and all were answered. The patient agreed with the plan and demonstrated an understanding of the instructions.   The patient was advised to call back or seek an in-person evaluation if the symptoms worsen or if the condition fails to improve as anticipated.  I provided 15 minutes of non-face-to-face time during this encounter.

## 2020-05-09 ENCOUNTER — Encounter: Payer: Self-pay | Admitting: Family Medicine

## 2020-05-09 ENCOUNTER — Ambulatory Visit (INDEPENDENT_AMBULATORY_CARE_PROVIDER_SITE_OTHER): Payer: 59 | Admitting: Family Medicine

## 2020-05-09 ENCOUNTER — Other Ambulatory Visit: Payer: Self-pay

## 2020-05-09 VITALS — BP 116/72 | HR 95 | Temp 97.6°F | Ht 63.0 in | Wt 246.2 lb

## 2020-05-09 DIAGNOSIS — R110 Nausea: Secondary | ICD-10-CM

## 2020-05-09 DIAGNOSIS — F419 Anxiety disorder, unspecified: Secondary | ICD-10-CM | POA: Diagnosis not present

## 2020-05-09 DIAGNOSIS — K921 Melena: Secondary | ICD-10-CM | POA: Diagnosis not present

## 2020-05-09 DIAGNOSIS — R1030 Lower abdominal pain, unspecified: Secondary | ICD-10-CM | POA: Diagnosis not present

## 2020-05-09 DIAGNOSIS — K5900 Constipation, unspecified: Secondary | ICD-10-CM

## 2020-05-09 LAB — POCT HEMOGLOBIN: Hemoglobin: 15.3 g/dL — AB (ref 11–14.6)

## 2020-05-09 MED ORDER — BUSPIRONE HCL 10 MG PO TABS
ORAL_TABLET | ORAL | 2 refills | Status: DC
Start: 2020-05-09 — End: 2020-07-06

## 2020-05-09 NOTE — Progress Notes (Signed)
Patient ID: Debra Stuart, female    DOB: April 23, 1995, 25 y.o.   MRN: [id number]   Chief Complaint  Patient presents with  . Episodes of nausea and vomiting    Thought it was related to anxiety but noticed blood in her stool yesterday for the first time   Subjective:    HPI  F/u anxiety and having some nausea/stomach pain.  Woke up this am with stomach hurting, down lower abd, Tried to have bm and couldn't go.  H/o in family of internal hemorrhoids.  noticed 3pm yesterday tried to have bm and small amt and had bright red blood.  Saw a large amt of blood.  Having some concerns of constipation. Feeling not fully emptying. Had some vomiting last night, "thought anxiety might have caused it."  Anxiety- Taking buspar 5mg  bid.  Felt 10mg  was too much. Taking 1/2 10mg  tab in am, then again around lunch.  Taking 1/2 about 4x per day.  Feeling in mornings "panicky feeling" when waking up.  Last night argument with mother and worsened the anxiety.  Not having regular periods now, last one 3 yrs ago has IUD.  Saw gyn with sharp pain in left lower quad and found to have rt sided ovarian cyst.  Resolved, 6 months ago. occ feels a slight pain there but it resolves on it's own. No h/o abd surgery. U/s uterus in 8/21.   Medical History Debra Stuart has a past medical history of Abnormal Pap smear of cervix (08/04/2019), Chronic knee pain, Chronic mental illness, and Vision abnormalities.   Outpatient Encounter Medications as of 05/09/2020  Medication Sig  . albuterol (VENTOLIN HFA) 108 (90 Base) MCG/ACT inhaler Inhale 2 puffs into the lungs every 4 (four) hours as needed for wheezing.  . busPIRone (BUSPAR) 10 MG tablet Take 1/2 tab p.o. tid prn anxiety  . Ibuprofen 200 MG CAPS Take by mouth.  . levonorgestrel (LILETTA, 52 MG,) 19.5 MCG/DAY IUD IUD 1 each by Intrauterine route once.  . [DISCONTINUED] busPIRone (BUSPAR) 10 MG tablet Take 1 tablet (10 mg total) by mouth 3 (three) times daily as  needed.  . [DISCONTINUED] ondansetron (ZOFRAN-ODT) 4 MG disintegrating tablet Take 1 tablet (4 mg total) by mouth every 8 (eight) hours as needed for nausea or vomiting.   No facility-administered encounter medications on file as of 05/09/2020.     Review of Systems  Constitutional: Negative for chills and fever.  HENT: Negative for congestion, rhinorrhea and sore throat.   Respiratory: Negative for cough, shortness of breath and wheezing.   Cardiovascular: Negative for chest pain and leg swelling.  Gastrointestinal: Positive for abdominal pain and nausea. Negative for diarrhea and vomiting.  Genitourinary: Negative for dysuria and frequency.  Musculoskeletal: Negative for arthralgias and back pain.  Skin: Negative for rash.  Neurological: Negative for dizziness, weakness and headaches.     Vitals BP 116/72   Pulse 95   Temp 97.6 F (36.4 C) (Oral)   Ht 5\' 3"  (1.6 m)   Wt 246 lb 3.2 oz (111.7 kg)   SpO2 97%   BMI 43.61 kg/m   Objective:   Physical Exam Vitals and nursing note reviewed.  Constitutional:      Appearance: Normal appearance.  HENT:     Head: Normocephalic and atraumatic.     Nose: Nose normal.     Mouth/Throat:     Mouth: Mucous membranes are moist.     Pharynx: Oropharynx is clear.  Eyes:     Extraocular Movements: Extraocular  movements intact.     Conjunctiva/sclera: Conjunctivae normal.     Pupils: Pupils are equal, round, and reactive to light.  Cardiovascular:     Rate and Rhythm: Normal rate and regular rhythm.     Pulses: Normal pulses.     Heart sounds: Normal heart sounds.  Pulmonary:     Effort: Pulmonary effort is normal.     Breath sounds: Normal breath sounds. No wheezing, rhonchi or rales.  Abdominal:     General: Abdomen is flat. Bowel sounds are normal. There is no distension.     Palpations: Abdomen is soft. There is no mass.     Tenderness: There is abdominal tenderness (mild in lower abd). There is no guarding or rebound.      Hernia: No hernia is present.  Musculoskeletal:        General: Normal range of motion.     Right lower leg: No edema.     Left lower leg: No edema.  Skin:    General: Skin is warm and dry.     Findings: No lesion or rash.  Neurological:     General: No focal deficit present.     Mental Status: She is alert and oriented to person, place, and time.  Psychiatric:        Mood and Affect: Mood normal.        Behavior: Behavior normal.      Assessment and Plan   1. Lower abdominal pain  2. Blood in stool - POCT hemoglobin  3. Anxiety - busPIRone (BUSPAR) 10 MG tablet; Take 1/2 tab p.o. tid prn anxiety  Dispense: 45 tablet; Refill: 2  4. Nausea  5. Constipation, unspecified constipation type   Lower abd pain/nausea- some component of anxiety with her abd pain.  Will cont with buspar.  Call or rto if vomiting or more blood in stool.  Tylenol for lower abd pain prn. Take zofran prn for nausea.  Hemoccult- neg.  Finger stick hb- normal in office today. Results for orders placed or performed in visit on 05/09/20  POCT hemoglobin  Result Value Ref Range   Hemoglobin 15.3 (A) 11 - 14.6 g/dL    Constipation- increase water, fiber diet, and colace or miralax prn.  Anxiety- uncontrolled.  Pt requesting refill buspar. Pt unable to tolerate side effects of ssri or snri.  Offered counseling.  Pt declining for now.   Return if symptoms worsen or fail to improve.

## 2020-05-09 NOTE — Patient Instructions (Signed)
Constipation, Adult Constipation is when a person has trouble pooping (having a bowel movement). When you have this condition, you may poop fewer than 3 times a week. Your poop (stool) may also be dry, hard, or bigger than normal. Follow these instructions at home: Eating and drinking  Eat foods that have a lot of fiber, such as: ? Fresh fruits and vegetables. ? Whole grains. ? Beans.  Eat less of foods that are low in fiber and high in fat and sugar, such as: ? French fries. ? Hamburgers. ? Cookies. ? Candy. ? Soda.  Drink enough fluid to keep your pee (urine) pale yellow.   General instructions  Exercise regularly or as told by your doctor. Try to do 150 minutes of exercise each week.  Go to the restroom when you feel like you need to poop. Do not hold it in.  Take over-the-counter and prescription medicines only as told by your doctor. These include any fiber supplements.  When you poop: ? Do deep breathing while relaxing your lower belly (abdomen). ? Relax your pelvic floor. The pelvic floor is a group of muscles that support the rectum, bladder, and intestines (as well as the uterus in women).  Watch your condition for any changes. Tell your doctor if you notice any.  Keep all follow-up visits as told by your doctor. This is important. Contact a doctor if:  You have pain that gets worse.  You have a fever.  You have not pooped for 4 days.  You vomit.  You are not hungry.  You lose weight.  You are bleeding from the opening of the butt (anus).  You have thin, pencil-like poop. Get help right away if:  You have a fever, and your symptoms suddenly get worse.  You leak poop or have blood in your poop.  Your belly feels hard or bigger than normal (bloated).  You have very bad belly pain.  You feel dizzy or you faint. Summary  Constipation is when a person poops fewer than 3 times a week, has trouble pooping, or has poop that is dry, hard, or bigger than  normal.  Eat foods that have a lot of fiber.  Drink enough fluid to keep your pee (urine) pale yellow.  Take over-the-counter and prescription medicines only as told by your doctor. These include any fiber supplements. This information is not intended to replace advice given to you by your health care provider. Make sure you discuss any questions you have with your health care provider. Document Revised: 12/30/2018 Document Reviewed: 12/30/2018 Elsevier Patient Education  2021 [employer]  

## 2020-05-10 ENCOUNTER — Telehealth: Payer: Self-pay | Admitting: Family Medicine

## 2020-05-10 MED ORDER — ONDANSETRON 4 MG PO TBDP
4.0000 mg | ORAL_TABLET | Freq: Three times a day (TID) | ORAL | 0 refills | Status: DC | PRN
Start: 2020-05-10 — End: 2020-08-24

## 2020-05-10 NOTE — Telephone Encounter (Signed)
done

## 2020-05-10 NOTE — Telephone Encounter (Signed)
Patient states her dog ate her Zofran 4mg  and wanting to know if you can call in some more to [facility] and [facility]

## 2020-05-10 NOTE — Telephone Encounter (Signed)
Patient notified

## 2020-06-06 ENCOUNTER — Ambulatory Visit: Payer: 59 | Admitting: Dermatology

## 2020-06-22 ENCOUNTER — Telehealth: Payer: Self-pay | Admitting: Family Medicine

## 2020-06-22 NOTE — Telephone Encounter (Signed)
Patient was having a panic attack this morning a took a buspar 10 mg but she threw it up. She wanting to know if she should take another one later. Please advise

## 2020-06-22 NOTE — Telephone Encounter (Signed)
If still has zofran nausea meds, then take one and then later try to take buspar again.   Dr. Ladona Ridgel

## 2020-06-22 NOTE — Telephone Encounter (Signed)
Telephone call-voicemail not set up ?

## 2020-06-26 ENCOUNTER — Ambulatory Visit: Payer: 59 | Admitting: Family Medicine

## 2020-06-26 ENCOUNTER — Other Ambulatory Visit: Payer: Self-pay

## 2020-06-26 ENCOUNTER — Emergency Department (HOSPITAL_COMMUNITY): Payer: 59

## 2020-06-26 ENCOUNTER — Emergency Department (HOSPITAL_COMMUNITY)
Admission: EM | Admit: 2020-06-26 | Discharge: 2020-06-26 | Disposition: A | Discharge status: Home / Self Care | Payer: 59 | Attending: Emergency Medicine | Admitting: Emergency Medicine

## 2020-06-26 ENCOUNTER — Encounter (HOSPITAL_COMMUNITY): Payer: Self-pay

## 2020-06-26 DIAGNOSIS — J069 Acute upper respiratory infection, unspecified: Secondary | ICD-10-CM | POA: Insufficient documentation

## 2020-06-26 DIAGNOSIS — R059 Cough, unspecified: Secondary | ICD-10-CM | POA: Diagnosis present

## 2020-06-26 DIAGNOSIS — Z20822 Contact with and (suspected) exposure to covid-19: Secondary | ICD-10-CM | POA: Insufficient documentation

## 2020-06-26 DIAGNOSIS — F17210 Nicotine dependence, cigarettes, uncomplicated: Secondary | ICD-10-CM | POA: Insufficient documentation

## 2020-06-26 HISTORY — DX: Anxiety disorder, unspecified: F41.9

## 2020-06-26 LAB — SARS CORONAVIRUS 2 (TAT 6-24 HRS): SARS Coronavirus 2: NEGATIVE

## 2020-06-26 MED ORDER — ALBUTEROL SULFATE HFA 108 (90 BASE) MCG/ACT IN AERS
4.0000 | INHALATION_SPRAY | Freq: Once | RESPIRATORY_TRACT | Status: AC
  Administered 2020-06-26: 4 via RESPIRATORY_TRACT
  Filled 2020-06-26: qty 6.7

## 2020-06-26 NOTE — ED Triage Notes (Signed)
Patient stated she has been feeling sick since last week, did a home Covid test on Friday that was negative.

## 2020-06-26 NOTE — Discharge Instructions (Addendum)
You were seen today for an upper respiratory infection. Use Flonase as directed for your congestion and Mucinex/Robitussin for your cough.  I want you to use the albuterol [place] 2 puffs in the morning and 2 puffs at night until your illness resolves.  Continue to stay well-hydrated. . Continued to alternate between Tylenol and ibuprofen for pain. Followup with your primary care doctor in 5-7 days or referral for urgent care for recheck of ongoing symptoms however return to emergency department for emergent changing or worsening of symptoms- example: shortness of breath, chest pain, difficulty swallowing, muffled voice.  Your COVID test will result in the next 6 to 24 hours, if it is positive you CDC guidelines and isolate

## 2020-06-26 NOTE — ED Provider Notes (Signed)
[facility] EMERGENCY DEPARTMENT Provider Note   CSN: [id number] Arrival date & time: 06/26/20  1058     History Chief Complaint  Patient presents with  . Cough    Debra Stuart is a 25 y.o. female with no prior past medical history that presents to the emergency department today for URI symptoms.  Patient states that on Wednesday she started feeling ill after going to a concert, started having productive cough, nausea vomiting and diarrhea.  Patient states that she took a at home COVID test which was negative.  Denies any sick contacts.  Denies any fevers.  Has not been taking anything for this.  Patient denies any history of asthma or lung disease.  States that she feels slightly short of breath when she coughs.  Has been able to eat and drink normally.  Denies any congestion, sore throat, headache or myalgias.  Patient states that she had COVID 90 days ago, has been vaccinated but not boosted.  Denies any chest pain or rash .  No other complaints at this time.  HPI     Past Medical History:  Diagnosis Date  . Abnormal Pap smear of cervix 08/04/2019   LLSIL, repeat in 1 year  . Anxiety   . Chronic knee pain   . Chronic mental illness   . Vision abnormalities     Patient Active Problem List   Diagnosis Date Noted  . LLQ pain 10/06/2019  . Abnormal Pap smear of cervix 08/04/2019  . Encounter for gynecological examination with Papanicolaou smear of cervix 07/29/2019  . IUD (intrauterine device) in place 05/26/2019  . BV (bacterial vaginosis) 05/26/2019  . Vaginal discharge 05/26/2019  . Vaginal itching 05/26/2019  . Depression 05/26/2019  . Episode of shaking 03/05/2018  . 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 No pertinent surgical history.   OB History    Gravida  0   Para  0   Term  0   Preterm  0   AB  0   Living  0     SAB  0   IAB  0   Ectopic  0   Multiple  0   Live Births  0            Family History  Problem Relation Age of Onset  . Seizures Father   . Depression Father   . Drug abuse Father   . Anxiety disorder Sister   . OCD Sister   . Obsessive Compulsive Disorder Sister   . Depression Mother   . Depression Half-Brother     Social History   Tobacco Use  . Smoking status: Current Every Day Smoker    Years: 1.00    Types: Cigarettes, E-cigarettes  . Smokeless tobacco: Never Used  . Tobacco comment: 1 cigarette per day per patient's report on 03/10/2018  Vaping Use  . Vaping Use: Every day  . Start date: 04/11/2016  Substance Use Topics  . Alcohol use: Not Currently    Comment: occasionally  . Drug use: Yes    Types: Marijuana    Home Medications Prior to Admission medications   Medication Sig Start Date End Date Taking? Authorizing Provider  albuterol (VENTOLIN HFA) 108 (90 Base) MCG/ACT inhaler Inhale 2 puffs into the lungs every 4 (four) hours as needed for wheezing. 03/28/20   Laroy Apple M, DO  busPIRone (BUSPAR) 10 MG tablet Take 1/2 tab p.o. tid prn anxiety 05/09/20   Ladona Ridgel,  Malena M, DO  Ibuprofen 200 MG CAPS Take by mouth.    [provider]  levonorgestrel (LILETTA, 52 MG,) 19.5 MCG/DAY IUD IUD 1 each by Intrauterine route once.    [provider]  ondansetron (ZOFRAN-ODT) 4 MG disintegrating tablet Take 1 tablet (4 mg total) by mouth every 8 (eight) hours as needed for nausea or vomiting. 05/10/20   Annalee Gentaaylor, Malena M, DO    Allergies    Patient has no known allergies.  Review of Systems   Review of Systems  Constitutional: Negative for chills, diaphoresis, fatigue and fever.  HENT: Negative for congestion, drooling, ear pain, mouth sores, nosebleeds, postnasal drip, rhinorrhea, sinus pressure, sinus pain, sneezing, sore throat and trouble swallowing.   Eyes: Negative for pain and visual disturbance.  Respiratory: Positive for cough. Negative for shortness of breath and wheezing.   Cardiovascular: Negative for chest  pain, palpitations and leg swelling.  Gastrointestinal: Positive for diarrhea, nausea and vomiting. Negative for abdominal distention and abdominal pain.  Genitourinary: Negative for difficulty urinating.  Musculoskeletal: Negative for back pain, neck pain and neck stiffness.  Skin: Negative for pallor.  Neurological: Negative for dizziness, speech difficulty, weakness and headaches.  Psychiatric/Behavioral: Negative for confusion.    Physical Exam Updated Vital Signs BP 117/73 (BP Location: Left Arm)   Pulse 90   Temp 98.1 F (36.7 C) (Oral)   Resp 20   Ht 5\' 3"  (1.6 m)   Wt 99.8 kg   SpO2 99%   BMI 38.97 kg/m   Physical Exam Constitutional:      General: She is not in acute distress.    Appearance: Normal appearance. She is not ill-appearing, toxic-appearing or diaphoretic.     Comments: Patient without acute respiratory stress.  Patient is sitting comfortably in bed, no tripoding, use of accessory muscles.  Patient is speaking to me in full sentences.  Handling secretions well.  HENT:     Head: Normocephalic and atraumatic.     Jaw: There is normal jaw occlusion. No trismus, swelling or malocclusion.     Nose: No congestion or rhinorrhea.     Right Sinus: No maxillary sinus tenderness or frontal sinus tenderness.     Left Sinus: No maxillary sinus tenderness or frontal sinus tenderness.     Mouth/Throat:     Mouth: Mucous membranes are moist. No oral lesions.     Dentition: Normal dentition.     Tongue: No lesions.     Palate: No mass and lesions.     Pharynx: Oropharynx is clear. Uvula midline. No pharyngeal swelling, oropharyngeal exudate, posterior oropharyngeal erythema or uvula swelling.     Tonsils: No tonsillar exudate or tonsillar abscesses. 1+ on the right. 1+ on the left.     Comments: Patient without tonsillar enlargement or exudate.  No signs of peritonsillar abscess, palate without any tenderness or masses palpated.  No swelling under the tongue, uvula is  midline without any inflammation. Eyes:     General: No visual field deficit.       Right eye: No discharge.        Left eye: No discharge.     Extraocular Movements: Extraocular movements intact.     Conjunctiva/sclera: Conjunctivae normal.     Pupils: Pupils are equal, round, and reactive to light.  Cardiovascular:     Rate and Rhythm: Normal rate and regular rhythm.     Pulses: Normal pulses.     Heart sounds: Normal heart sounds. No murmur heard. No friction  rub. No gallop.   Pulmonary:     Effort: Pulmonary effort is normal. No respiratory distress.     Breath sounds: No stridor. Wheezing present. No rhonchi or rales.  Chest:     Chest wall: No tenderness.  Abdominal:     General: Abdomen is flat. Bowel sounds are normal. There is no distension.     Palpations: Abdomen is soft.     Tenderness: There is no abdominal tenderness. There is no right CVA tenderness or left CVA tenderness.  Musculoskeletal:        General: No swelling or tenderness. Normal range of motion.     Cervical back: Normal range of motion. No rigidity or tenderness.     Right lower leg: No edema.     Left lower leg: No edema.  Lymphadenopathy:     Cervical: No cervical adenopathy.  Skin:    General: Skin is warm and dry.     Capillary Refill: Capillary refill takes less than 2 seconds.     Findings: No erythema or rash.  Neurological:     General: No focal deficit present.     Mental Status: She is alert and oriented to person, place, and time.     Cranial Nerves: Cranial nerves are intact. No cranial nerve deficit or facial asymmetry.     Motor: Motor function is intact. No weakness.     Coordination: Coordination is intact.     Gait: Gait is intact. Gait normal.  Psychiatric:        Mood and Affect: Mood normal.     ED Results / Procedures / Treatments   Labs (all labs ordered are listed, but only abnormal results are displayed) Labs Reviewed  SARS CORONAVIRUS 2 (TAT 6-24 HRS)     EKG None  Radiology No results found.  Procedures Procedures   Medications Ordered in ED Medications  albuterol (VENTOLIN HFA) 108 (90 Base) MCG/ACT inhaler 4 puff (has no administration in time range)    ED Course  I have reviewed the triage vital signs and the nursing notes.  Pertinent labs & imaging results that were available during my care of the patient were reviewed by me and considered in my medical decision making (see chart for details).    MDM Rules/Calculators/A&P                         Debra Stuart is a 25 y.o. female with no prior past medical history that presents to the emergency department today for URI symptoms.  Patient appears well, no respite distress, normal vitals, no fevers.  Patient primarily concerned about cough.  Patient does have very mild expiratory wheezes on exam, will give albuterol and obtain chest x-ray and reevaluate.  High likelihood for viral illness, COVID is also in the differential.  Chest x-ray interpreted me with no acute cardiopulmonary disease.  Upon reevaluation patient feels better with albuterol administration.  Symptomatic treatment discussed, COVID test pending.  Patient be discharged at this time, appears very well.  Patient will follow up with PCP.  Doubt need for further emergent work up at this time. I explained the diagnosis and have given explicit precautions to return to the ER including for any other new or worsening symptoms. The patient understands and accepts the medical plan as it's been dictated and I have answered their questions. Discharge instructions concerning home care and prescriptions have been given. The patient is STABLE and is discharged to home in  good condition.  Debra Stuart was evaluated in Emergency Department on 06/26/2020 for the symptoms described in the history of present illness. She was evaluated in the context of the global COVID-19 pandemic, which necessitated consideration that the patient might  be at risk for infection with the SARS-CoV-2 virus that causes COVID-19. Institutional protocols and algorithms that pertain to the evaluation of patients at risk for COVID-19 are in a state of rapid change based on information released by regulatory bodies including the CDC and federal and state organizations. These policies and algorithms were followed during the patient's care in the ED.  Final Clinical Impression(s) / ED Diagnoses Final diagnoses:  Viral URI with cough    Rx / DC Orders ED Discharge Orders    None       Farrel Gordon, PA-C 06/26/20 1416    Vanetta Mulders, MD 06/27/20 [phone]

## 2020-06-26 NOTE — Telephone Encounter (Signed)
Pt contacted and verbalized understanding.  

## 2020-06-27 ENCOUNTER — Ambulatory Visit: Payer: 59 | Admitting: Family Medicine

## 2020-06-27 ENCOUNTER — Telehealth: Payer: Self-pay

## 2020-06-27 ENCOUNTER — Encounter: Payer: Self-pay | Admitting: Family Medicine

## 2020-06-27 ENCOUNTER — Ambulatory Visit (INDEPENDENT_AMBULATORY_CARE_PROVIDER_SITE_OTHER): Payer: 59 | Admitting: Family Medicine

## 2020-06-27 VITALS — HR 89 | Temp 97.5°F | Ht 63.0 in | Wt 223.0 lb

## 2020-06-27 DIAGNOSIS — R059 Cough, unspecified: Secondary | ICD-10-CM | POA: Diagnosis not present

## 2020-06-27 DIAGNOSIS — J020 Streptococcal pharyngitis: Secondary | ICD-10-CM

## 2020-06-27 DIAGNOSIS — J301 Allergic rhinitis due to pollen: Secondary | ICD-10-CM | POA: Insufficient documentation

## 2020-06-27 DIAGNOSIS — J40 Bronchitis, not specified as acute or chronic: Secondary | ICD-10-CM

## 2020-06-27 DIAGNOSIS — J029 Acute pharyngitis, unspecified: Secondary | ICD-10-CM | POA: Diagnosis not present

## 2020-06-27 LAB — POCT RAPID STREP A (OFFICE): Rapid Strep A Screen: POSITIVE — AB

## 2020-06-27 MED ORDER — PENICILLIN V POTASSIUM 500 MG PO TABS: 500.0000 mg | ORAL_TABLET | Freq: Three times a day (TID) | ORAL | 0 refills | Status: AC

## 2020-06-27 MED ORDER — CETIRIZINE HCL 10 MG PO TABS: 10.0000 mg | ORAL_TABLET | Freq: Every day | ORAL | 1 refills | Status: DC

## 2020-06-27 MED ORDER — PREDNISONE 10 MG PO TABS: ORAL_TABLET | ORAL | 0 refills | Status: DC

## 2020-06-27 MED ORDER — BENZONATATE 100 MG PO CAPS: 100.0000 mg | ORAL_CAPSULE | Freq: Two times a day (BID) | ORAL | 0 refills | Status: DC | PRN

## 2020-06-27 MED ORDER — FLUTICASONE PROPIONATE 50 MCG/ACT NA SUSP: 2.0000 | Freq: Every day | NASAL | 1 refills | Status: DC

## 2020-06-27 NOTE — Addendum Note (Signed)
Addended by: Metro Kung on: 06/27/2020 02:28 PM   Modules accepted: Orders

## 2020-06-27 NOTE — Patient Instructions (Addendum)
Prednisone warning: Prednisone is an anti-inflammatory medication. It is best to only use it for short periods of time. It can cause  your blood pressure to increase and it can cause your blood sugar to increase. If you are a diabetic, please check your blood sugar twice per day while you are taking prednisone. If your sugars are > 200 or < 70 please seek medical attention right away.   How to Perform a Sinus Rinse A sinus rinse is a home treatment. It rinses your sinuses with a mixture of salt and water (saline solution). Sinuses are air-filled spaces in your skull behind the bones of your face and forehead. They open into your nasal cavity. A sinus rinse can help to clear your nasal cavity. It can clear mucus, dirt, dust, or pollen. You may do a sinus rinse when you have:  A cold.  A virus.  Allergies.  A sinus infection.  A stuffy nose. Talk with your doctor about whether a sinus rinse might help you. What are the risks? A sinus rinse is normally very safe and helpful. However, there are a few risks. These include:  A burning feeling in the sinuses. This may happen if you do not make the saline solution as instructed. Be sure to follow all directions when making the saline solution.  Nasal irritation.  Infection from unclean water. This is rare, but possible. Do not do a sinus rinse if you have had:  Ear or nasal surgery.  An ear infection.  Blocked ears. Supplies needed:  Saline solution or powder.  Distilled or germ-free (sterile) water may be needed to mix with saline powder. ? You may use boiled and cooled tap water. Boil tap water for 5 minutes; cool until it is lukewarm. Use within 24 hours. ? Do not use regular tap water to mix with the saline solution.  Neti pot or nasal rinse bottle. This releases the saline solution into your nose and through your sinuses. You can buy neti pots and rinse bottles: ? At your local pharmacy. ? At a health food  store. ? Online. How to perform a sinus rinse 1. Wash your hands with soap and water. 2. Wash your device using the directions that came with it. 3. Dry your device. 4. Use the solution that comes with your device or one that is sold separately in stores. Follow the mixing directions on the package if you need to mix with sterile or distilled water. 5. Fill your device with the amount of saline solution stated in the device instructions. 6. Stand over a sink and tilt your head sideways over the sink. 7. Place the spout of the device in your upper nostril (the one closer to the ceiling). 8. Gently pour or squeeze the saline solution into your nasal cavity. The liquid should drain to your lower nostril if you are not too stuffed up (congested). 9. While rinsing, breathe through your open mouth. 10. Gently blow your nose to clear any mucus and rinse solution. Blowing too hard may cause ear pain. 11. Repeat in your other nostril. 12. Clean and rinse your device with clean water. 13. Air-dry your device. Talk with your doctor or pharmacist if you have questions about how to do a sinus rinse.   Summary  A sinus rinse is a home treatment. It rinses your sinuses with a mixture of salt and water (saline solution).  A sinus rinse is normally very safe and helpful. Follow all instructions carefully.  Talk with  your doctor about whether a sinus rinse might help you. This information is not intended to replace advice given to you by your health care provider. Make sure you discuss any questions you have with your health care provider. Document Revised: 11/23/2019 Document Reviewed: 11/23/2019 Elsevier Patient Education  2021 [employer].

## 2020-06-27 NOTE — Progress Notes (Addendum)
Patient ID: Debra Stuart, female    DOB: 1995/03/12, 25 y.o.   MRN: [id number]   No chief complaint on file.  Subjective:  CC: sore throat, cough, congestion, body aches, chills, n/v/d   Problem.  Presents today with complaint of congestion, cough, sore throat, body aches, chills nausea vomiting and diarrhea.  Symptoms have been present for 7 days.  Reports that she went to the emergency department yesterday where she was diagnosed with an upper respiratory infection with cough.  She had a chest x-ray which was negative I gave her an albuterol nebulizer and she also tested negative for COVID yesterday.  Has also taken a home COVID test which is also negative.  Like the cough is getting worse.  Denies known fever endorses chills.  See review of systems.    Patient presents today with respiratory illness Number of days present- 7 days  Symptoms include- sore throat, cough, congestion, body aches, hot and cold chills, nausea, vonmiting and diarrhea  Presence of worrisome signs (severe shortness of breath, lethargy, etc.) - yes lethargic and sob  Recent/current visit to urgent care or ER- yesterday - [facility]   Recent direct exposure to Covid- none  Any current Covid testing- yes. At [facility] ER yesterday and pt states it was negative   Medical History Kedra has a past medical history of Abnormal Pap smear of cervix (08/04/2019), Anxiety, Chronic knee pain, Chronic mental illness, and Vision abnormalities.   Outpatient Encounter Medications as of 06/27/2020  Medication Sig  . albuterol (VENTOLIN HFA) 108 (90 Base) MCG/ACT inhaler Inhale 2 puffs into the lungs every 4 (four) hours as needed for wheezing.  . benzonatate (TESSALON) 100 MG capsule Take 1 capsule (100 mg total) by mouth 2 (two) times daily as needed for cough.  . busPIRone (BUSPAR) 10 MG tablet Take 1/2 tab p.o. tid prn anxiety (Patient taking differently: Take 10 mg by mouth 3 (three) times daily.)  . cetirizine (ZYRTEC  ALLERGY) 10 MG tablet Take 1 tablet (10 mg total) by mouth daily.  . fluticasone (FLONASE) 50 MCG/ACT nasal spray Place 2 sprays into both nostrils daily.  . Ibuprofen 200 MG CAPS Take by mouth.  . levonorgestrel (LILETTA, 52 MG,) 19.5 MCG/DAY IUD IUD 1 each by Intrauterine route once.  . Multiple Vitamin (MULTIVITAMIN WITH MINERALS) TABS tablet Take 1 tablet by mouth daily.  . ondansetron (ZOFRAN-ODT) 4 MG disintegrating tablet Take 1 tablet (4 mg total) by mouth every 8 (eight) hours as needed for nausea or vomiting.  . penicillin v potassium (VEETID) 500 MG tablet Take 1 tablet (500 mg total) by mouth 3 (three) times daily for 10 days.  . predniSONE (DELTASONE) 10 MG tablet Take 3 tablets by mouth for 3 days, then 2 tablets by mouth for 3 days, then 1 tablet by mouth for 3 days.   No facility-administered encounter medications on file as of 06/27/2020.     Review of Systems  Constitutional: Positive for chills. Negative for fever.  HENT: Positive for congestion and sore throat. Negative for ear pain.   Respiratory: Positive for cough, shortness of breath and wheezing.        SOB with coughing  Cardiovascular: Positive for chest pain.       Mid sternal pain with coughing.   Gastrointestinal: Positive for abdominal pain, diarrhea, nausea and vomiting.       Intentionally not eating, lost 47 pounds.   Neurological: Positive for headaches.       Started  3-4 days ago and only 1-2 times.      Vitals Pulse 89   Temp (!) 97.5 F (36.4 C)   Ht 5\' 3"  (1.6 m)   Wt 223 lb (101.2 kg)   SpO2 98%   BMI 39.50 kg/m   Objective:   Physical Exam Vitals reviewed.  Constitutional:      General: She is not in acute distress.    Appearance: Normal appearance. She is not ill-appearing.  HENT:     Right Ear: Tympanic membrane normal.     Left Ear: Tympanic membrane normal.     Nose:     Right Turbinates: Swollen.     Left Turbinates: Swollen.     Right Sinus: No maxillary sinus tenderness  or frontal sinus tenderness.     Left Sinus: No frontal sinus tenderness.     Mouth/Throat:     Mouth: Mucous membranes are moist.     Pharynx: Uvula midline. Posterior oropharyngeal erythema present.     Tonsils: No tonsillar exudate. 1+ on the right. 0 on the left.  Cardiovascular:     Rate and Rhythm: Normal rate and regular rhythm.     Heart sounds: Normal heart sounds.  Pulmonary:     Effort: Pulmonary effort is normal.     Breath sounds: Normal breath sounds.  Abdominal:     General: Bowel sounds are normal.  Skin:    General: Skin is warm and dry.  Neurological:     General: No focal deficit present.     Mental Status: She is alert.  Psychiatric:        Behavior: Behavior normal.    Results for orders placed or performed in visit on 06/27/20  POCT rapid strep A  Result Value Ref Range   Rapid Strep A Screen Positive (A) Negative     Assessment and Plan   1. Bronchitis - predniSONE (DELTASONE) 10 MG tablet; Take 3 tablets by mouth for 3 days, then 2 tablets by mouth for 3 days, then 1 tablet by mouth for 3 days.  Dispense: 18 tablet; Refill: 0  2. Seasonal allergic rhinitis due to pollen - cetirizine (ZYRTEC ALLERGY) 10 MG tablet; Take 1 tablet (10 mg total) by mouth daily.  Dispense: 30 tablet; Refill: 1 - fluticasone (FLONASE) 50 MCG/ACT nasal spray; Place 2 sprays into both nostrils daily.  Dispense: 16 g; Refill: 1  3. Cough - benzonatate (TESSALON) 100 MG capsule; Take 1 capsule (100 mg total) by mouth 2 (two) times daily as needed for cough.  Dispense: 20 capsule; Refill: 0  4. Sore throat - POCT rapid strep A  5. Streptococcal sore throat - penicillin v potassium (VEETID) 500 MG tablet; Take 1 tablet (500 mg total) by mouth 3 (three) times daily for 10 days.  Dispense: 30 tablet; Refill: 0    Rapid strep positive. Will treat with PCN for 10 days.   Dry hacking cough throughout visit, no obvious shortness of breath.  Oxygen saturation 98% in office  today.  Will treat bronchitis with prednisone taper, treat seasonal allergies with Zyrtec and Flonase, and cough with Tessalon Perles.  Also recommend supportive therapy, adequate hydration, honey for cough is also effective.  No indication of infectious process today, TMs normal, lungs clear WITH  negative chest x-ray yesterday, no sinus pain or pressure upon physical exam.  Agrees with plan of care discussed today. Understands warning signs to seek further care: chest pain, shortness of breath, any significant change in health.  Understands to follow-up iF symptoms do not improve, or worsen.   Dorena Bodo, NP 06/27/2020

## 2020-06-27 NOTE — Addendum Note (Signed)
Addended by: Novella Olive on: 06/27/2020 02:37 PM   Modules accepted: Orders

## 2020-06-28 NOTE — Telephone Encounter (Signed)
Error

## 2020-07-05 ENCOUNTER — Other Ambulatory Visit: Payer: Self-pay

## 2020-07-05 ENCOUNTER — Ambulatory Visit (INDEPENDENT_AMBULATORY_CARE_PROVIDER_SITE_OTHER): Payer: 59 | Admitting: Clinical

## 2020-07-05 DIAGNOSIS — F319 Bipolar disorder, unspecified: Secondary | ICD-10-CM | POA: Diagnosis not present
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
## 2020-07-06 ENCOUNTER — Encounter: Payer: Self-pay | Admitting: Family Medicine

## 2020-07-06 ENCOUNTER — Other Ambulatory Visit: Payer: Self-pay

## 2020-07-06 ENCOUNTER — Ambulatory Visit (INDEPENDENT_AMBULATORY_CARE_PROVIDER_SITE_OTHER): Payer: 59 | Admitting: Family Medicine

## 2020-07-06 VITALS — BP 130/83 | HR 107 | Temp 97.9°F | Ht 63.0 in | Wt 223.0 lb

## 2020-07-06 DIAGNOSIS — F419 Anxiety disorder, unspecified: Secondary | ICD-10-CM

## 2020-07-06 DIAGNOSIS — F32A Depression, unspecified: Secondary | ICD-10-CM | POA: Diagnosis not present

## 2020-07-06 MED ORDER — BUSPIRONE HCL 10 MG PO TABS: 10.0000 mg | ORAL_TABLET | Freq: Three times a day (TID) | ORAL | 2 refills | Status: DC

## 2020-07-06 NOTE — Progress Notes (Signed)
Patient ID: Debra Stuart, female    DOB: Dec 05, 1995, 25 y.o.   MRN: [id number]   Chief Complaint  Patient presents with  . Anxiety   Subjective:    HPI  follow up on anxiety.  Started therapy yesterday and taking buspar.  Pt states she is doing better.  No new concerns of panic attacks.  Had a recent uri and went to urgent care and was given prednisone.  Has increased her appeitite.  Stating if taking buspar in am it helps with her anxiety and able to eat better. No further vomiting. Not needing to take the zofran.  Seen by counselor yesterday, per notes dx with bipolar 1 disorder with anxiety.     Medical History Debra Stuart has a past medical history of Abnormal Pap smear of cervix (08/04/2019), Anxiety, Chronic knee pain, Chronic mental illness, and Vision abnormalities.   Outpatient Encounter Medications as of 07/06/2020  Medication Sig  . albuterol (VENTOLIN HFA) 108 (90 Base) MCG/ACT inhaler Inhale 2 puffs into the lungs every 4 (four) hours as needed for wheezing.  . cetirizine (ZYRTEC ALLERGY) 10 MG tablet Take 1 tablet (10 mg total) by mouth daily.  . fluticasone (FLONASE) 50 MCG/ACT nasal spray Place 2 sprays into both nostrils daily.  . Ibuprofen 200 MG CAPS Take by mouth.  . levonorgestrel (LILETTA, 52 MG,) 19.5 MCG/DAY IUD IUD 1 each by Intrauterine route once.  . Multiple Vitamin (MULTIVITAMIN WITH MINERALS) TABS tablet Take 1 tablet by mouth daily.  . ondansetron (ZOFRAN-ODT) 4 MG disintegrating tablet Take 1 tablet (4 mg total) by mouth every 8 (eight) hours as needed for nausea or vomiting.  . penicillin v potassium (VEETID) 500 MG tablet Take 1 tablet (500 mg total) by mouth 3 (three) times daily for 10 days.  . [DISCONTINUED] busPIRone (BUSPAR) 10 MG tablet Take 1/2 tab p.o. tid prn anxiety (Patient taking differently: Take 10 mg by mouth 3 (three) times daily.)  . busPIRone (BUSPAR) 10 MG tablet Take 1 tablet (10 mg total) by mouth 3 (three) times daily.  .  [DISCONTINUED] benzonatate (TESSALON) 100 MG capsule Take 1 capsule (100 mg total) by mouth 2 (two) times daily as needed for cough.  . [DISCONTINUED] predniSONE (DELTASONE) 10 MG tablet Take 3 tablets by mouth for 3 days, then 2 tablets by mouth for 3 days, then 1 tablet by mouth for 3 days.   No facility-administered encounter medications on file as of 07/06/2020.     Review of Systems  Constitutional: Negative for chills and fever.  HENT: Negative for congestion, rhinorrhea and sore throat.   Respiratory: Negative for cough, shortness of breath and wheezing.   Cardiovascular: Negative for chest pain and leg swelling.  Gastrointestinal: Negative for abdominal pain, diarrhea, nausea and vomiting.  Genitourinary: Negative for dysuria and frequency.  Musculoskeletal: Negative for arthralgias and back pain.  Skin: Negative for rash.  Neurological: Negative for dizziness, weakness and headaches.  Psychiatric/Behavioral: Negative for dysphoric mood, self-injury, sleep disturbance and suicidal ideas. The patient is nervous/anxious.      Vitals BP 130/83   Pulse (!) 107   Temp 97.9 F (36.6 C)   Ht 5\' 3"  (1.6 m)   Wt 223 lb (101.2 kg)   SpO2 97%   BMI 39.50 kg/m   Objective:   Physical Exam Vitals and nursing note reviewed.  Constitutional:      General: She is not in acute distress.    Appearance: Normal appearance. She is not ill-appearing.  HENT:  Head: Normocephalic and atraumatic.  Eyes:     Extraocular Movements: Extraocular movements intact.     Conjunctiva/sclera: Conjunctivae normal.     Pupils: Pupils are equal, round, and reactive to light.  Cardiovascular:     Rate and Rhythm: Normal rate and regular rhythm.     Pulses: Normal pulses.     Heart sounds: Normal heart sounds.  Pulmonary:     Effort: Pulmonary effort is normal.     Breath sounds: Normal breath sounds. No wheezing, rhonchi or rales.  Musculoskeletal:        General: Normal range of motion.      Right lower leg: No edema.     Left lower leg: No edema.  Skin:    General: Skin is warm and dry.     Findings: No lesion or rash.  Neurological:     General: No focal deficit present.     Mental Status: She is alert and oriented to person, place, and time.  Psychiatric:        Mood and Affect: Mood normal.        Behavior: Behavior normal.        Thought Content: Thought content normal.        Judgment: Judgment normal.      Assessment and Plan   1. Anxiety - busPIRone (BUSPAR) 10 MG tablet; Take 1 tablet (10 mg total) by mouth 3 (three) times daily.  Dispense: 90 tablet; Refill: 2  2. Depression, unspecified depression type    Pt stating doing well with the buspar and not wanting to retry an SSRI at this time.  Pt wanting to do the counseling and see how it goes.  Feels symptoms are more controlled on the buspar at 10mg  tid. Will continue this.  Return in about 3 months (around 10/06/2020) for f/u anxiety.

## 2020-07-23 ENCOUNTER — Encounter: Payer: Self-pay | Admitting: Family Medicine

## 2020-08-01 ENCOUNTER — Ambulatory Visit (HOSPITAL_COMMUNITY): Payer: 59 | Admitting: Clinical

## 2020-08-10 ENCOUNTER — Other Ambulatory Visit: Payer: Self-pay

## 2020-08-10 ENCOUNTER — Ambulatory Visit (INDEPENDENT_AMBULATORY_CARE_PROVIDER_SITE_OTHER): Payer: 59 | Admitting: Clinical

## 2020-08-10 DIAGNOSIS — F319 Bipolar disorder, unspecified: Secondary | ICD-10-CM

## 2020-08-10 NOTE — Progress Notes (Signed)
Virtual Visit via Telephone Note  I connected with Debra Stuart on 08/10/20 at  8:00 AM EDT by telephone and verified that I am speaking with the correct person using two identifiers.  Location: Patient: Home Provider: Office   I discussed the limitations, risks, security and privacy concerns of performing an evaluation and management service by telephone and the availability of in person appointments. I also discussed with the patient that there may be a patient responsible charge related to this service. The patient expressed understanding and agreed to proceed.    THERAPIST PROGRESS NOTE   Session Time: 8:00 AM-8:45AM   Participation Level: Active   Behavioral Response: CasualAlertDepressed   Type of Therapy: Individual Therapy   Treatment Goals addressed: Coping   Interventions: CBT   Summary: Keyra Virella is a 25 y.o. female who presents with Bipolar Disorder with Anxiety. The OPT therapist worked with the patient for her initial OPT treatment session. The OPT therapist utilized Motivational Interviewing to assist in creating therapeutic repore. The patient in the session was engaged and work in collaboration giving feedback about her triggers and symptoms over the past few weeks including physical health appointments.The OPT therapist utilized Cognitive Behavioral Therapy through cognitive restructuring as well as worked with the patient on coping strategies to assist in management of mood and as she continues to work on getting in home task completed instead of serial multitasking and not complete.   Suicidal/Homicidal: Nowithout intent/plan   Therapist Response: The OPT therapist worked with the patient for the patients scheduled session. The patient was engaged in her session and gave feedback in relation to triggers, symptoms, and behavior responses over the past few weeks. The OPT therapist worked with the patient utilizing an in session Cognitive Behavioral Therapy exercise.  The patient was responsive in the session and verbalized, " I never take time for myself and I constantly fill my time". The OPT therapist worked with the patient on implementing positive thinking and reviewing self care, and balance of work leisure. The patient spoke about recently moving out of her parents home and trying to get settled in her new place. The OPT therapist will continue treatment work with the patient in her next scheduled session   Plan: Return again in 3 weeks.   Diagnosis:      Axis I: Bipolar Disorder w Anxiety.                             Axis II: No diagnosis   I discussed the assessment and treatment plan with the patient. The patient was provided an opportunity to ask questions and all were answered. The patient agreed with the plan and demonstrated an understanding of the instructions.   The patient was advised to call back or seek an in-person evaluation if the symptoms worsen or if the condition fails to improve as anticipated.   I provided 45 minutes of non-face-to-face time during this encounter.   Winfred Burn, LCSW  08/10/2020

## 2020-08-24 ENCOUNTER — Encounter: Payer: Self-pay | Admitting: Nurse Practitioner

## 2020-08-24 ENCOUNTER — Other Ambulatory Visit: Payer: Self-pay

## 2020-08-24 ENCOUNTER — Ambulatory Visit (INDEPENDENT_AMBULATORY_CARE_PROVIDER_SITE_OTHER): Payer: 59 | Admitting: Nurse Practitioner

## 2020-08-24 VITALS — BP 94/66 | HR 104 | Temp 98.2°F | Ht 63.0 in | Wt 214.0 lb

## 2020-08-24 DIAGNOSIS — K219 Gastro-esophageal reflux disease without esophagitis: Secondary | ICD-10-CM

## 2020-08-24 DIAGNOSIS — R5383 Other fatigue: Secondary | ICD-10-CM

## 2020-08-24 DIAGNOSIS — R55 Syncope and collapse: Secondary | ICD-10-CM

## 2020-08-24 DIAGNOSIS — K580 Irritable bowel syndrome with diarrhea: Secondary | ICD-10-CM | POA: Diagnosis not present

## 2020-08-24 MED ORDER — PANTOPRAZOLE SODIUM 40 MG PO TBEC: 40.0000 mg | DELAYED_RELEASE_TABLET | Freq: Every day | ORAL | 2 refills | Status: DC

## 2020-08-24 MED ORDER — ONDANSETRON 4 MG PO TBDP: 4.0000 mg | ORAL_TABLET | Freq: Three times a day (TID) | ORAL | 0 refills | Status: DC | PRN

## 2020-08-24 NOTE — Patient Instructions (Signed)
Irritable Bowel Syndrome, Adult  Irritable bowel syndrome (IBS) is a group of symptoms that affects the organs responsible for digestion (gastrointestinal or GI tract). IBS is not one specific disease. To regulate how the GI tract works, the body sends signals back and forth between the intestines and the brain. If you have IBS, there may be a problem with these signals. As a result, the GI tract does not function normally. The intestines may become more sensitive and overreact to certain things. This maybe especially true when you eat certain foods or when you are under stress. There are four types of IBS. These may be determined based on the consistency of your stool (feces): IBS with diarrhea. IBS with constipation. Mixed IBS. Unsubtyped IBS. It is important to know which type of IBS you have. Certain treatments are morelikely to be helpful for certain types of IBS. What are the causes? The exact cause of IBS is not known. What increases the risk? You may have a higher risk for IBS if you: Are female. Are younger than 3. Have a family history of IBS. Have a mental health condition, such as depression, anxiety, or post-traumatic stress disorder. Have had a bacterial infection of your GI tract. What are the signs or symptoms? Symptoms of IBS vary from person to person. The main symptom is abdominal pain or discomfort. Other symptoms usually include one or more of the following: Diarrhea, constipation, or both. Abdominal swelling or bloating. Feeling full after eating a small or regular-sized meal. Frequent gas. Mucus in the stool. A feeling of having more stool left after a bowel movement. Symptoms tend to come and go. They may be triggered by stress, mental healthconditions, or certain foods. How is this diagnosed? This condition may be diagnosed based on a physical exam, your medical history, and your symptoms. You may have tests, such as: Blood tests. Stool test. X-rays. CT  scan. Colonoscopy. This is a procedure in which your GI tract is viewed with a long, thin, flexible tube. How is this treated? There is no cure for IBS, but treatment can help relieve symptoms. Treatment depends on the type of IBS you have, and may include: Changes to your diet, such as: Avoiding foods that cause symptoms. Drinking more water. Following a low-FODMAP (fermentable oligosaccharides, disaccharides, monosaccharides, and polyols) diet for up to 6 weeks, or as told by your health care provider. FODMAPs are sugars that are hard for some people to digest. Eating more fiber. Eating medium-sized meals at the same times every day. Medicines. These may include: Fiber supplements, if you have constipation. Medicine to control diarrhea (antidiarrheal medicines). Medicine to help control muscle tightening (spasms) in your GI tract (antispasmodic medicines). Medicines to help with mental health conditions, such as antidepressants or tranquilizers. Talk therapy or counseling. Working with a diet and nutrition specialist (dietitian) to help create a food plan that is right for you. Managing your stress. Follow these instructions at home: Eating and drinking Eat a healthy diet. Eat medium-sized meals at about the same time every day. Do not eat large meals. Gradually eat more fiber-rich foods. These include whole grains, fruits, and vegetables. This may be especially helpful if you have IBS with constipation. Eat a diet low in FODMAPs. Drink enough fluid to keep your urine pale yellow. Keep a journal of foods that seem to trigger symptoms. Avoid foods and drinks that: Contain added sugar. Make your symptoms worse. Dairy products, caffeinated drinks, and carbonated drinks can make symptoms worse for  some people. General instructions Take over-the-counter and prescription medicines and supplements only as told by your health care provider. Get enough exercise. Do at least 150 minutes of  moderate-intensity exercise each week. Manage your stress. Getting enough sleep and exercise can help you manage stress. Keep all follow-up visits as told by your health care provider and therapist. This is important. Alcohol Use Do not drink alcohol if: Your health care provider tells you not to drink. You are pregnant, may be pregnant, or are planning to become pregnant. If you drink alcohol, limit how much you have: 0-1 drink a day for women. 0-2 drinks a day for men. Be aware of how much alcohol is in your drink. In the [place] one drink equals one typical bottle of beer (12 oz), one-half glass of wine (5 oz), or one shot of hard liquor (1 oz). Contact a health care provider if you have: Constant pain. Weight loss. Difficulty or pain when swallowing. Diarrhea that gets worse. Get help right away if you have: Severe abdominal pain. Fever. Diarrhea with symptoms of dehydration, such as dizziness or dry mouth. Bright red blood in your stool. Stool that is black and tarry. Abdominal swelling. Vomiting that does not stop. Blood in your vomit. Summary Irritable bowel syndrome (IBS) is not one specific disease. It is a group of symptoms that affects digestion. Your intestines may become more sensitive and overreact to certain things. This may be especially true when you eat certain foods or when you are under stress. There is no cure for IBS, but treatment can help relieve symptoms. This information is not intended to replace advice given to you by your health care provider. Make sure you discuss any questions you have with your healthcare provider. Document Revised: 10/14/2019 Document Reviewed: 10/14/2019 Elsevier Patient Education  2022 [employer].  Food Choices for Gastroesophageal Reflux Disease, Adult When you have gastroesophageal reflux disease (GERD), the foods you eat and your eating habits are very important. Choosing the right foods can help ease the discomfort of GERD.  Consider working with a dietitian to help you [employer] choices. What are tips for following this plan? Reading food labels Look for foods that are low in saturated fat. Foods that have less than 5% of daily value (DV) of fat and 0 g of trans fats may help with your symptoms. Cooking Cook foods using methods other than frying. This may include baking, steaming, grilling, or broiling. These are all methods that do not need a lot of fat for cooking. To add flavor, try to use herbs that are low in spice and acidity. Meal planning  Choose healthy foods that are low in fat, such as fruits, vegetables, whole grains, low-fat dairy products, lean meats, fish, and poultry. Eat frequent, small meals instead of three large meals each day. Eat your meals slowly, in a relaxed setting. Avoid bending over or lying down until 2-3 hours after eating. Limit high-fat foods such as fatty meats or fried foods. Limit your intake of fatty foods, such as oils, butter, and shortening. Avoid the following as told by your health care provider: Foods that cause symptoms. These may be different for different people. Keep a food diary to keep track of foods that cause symptoms. Alcohol. Drinking large amounts of liquid with meals. Eating meals during the 2-3 hours before bed.  Lifestyle Maintain a healthy weight. Ask your health care provider what weight is healthy for you. If you need to lose weight, work with your  health care provider to do so safely. Exercise for at least 30 minutes on 5 or more days each week, or as told by your health care provider. Avoid wearing clothes that fit tightly around your waist and chest. Do not use any products that contain nicotine or tobacco. These products include cigarettes, chewing tobacco, and vaping devices, such as e-cigarettes. If you need help quitting, ask your health care provider. Sleep with the head of your bed raised. Use a wedge under the mattress or blocks under  the bed frame to raise the head of the bed. Chew sugar-free gum after mealtimes. What foods should I eat?  Eat a healthy, well-balanced diet of fruits, vegetables, whole grains, low-fat dairy products, lean meats, fish, and poultry. Each person is different. Foods that may trigger symptoms in one person may not trigger any symptoms in another person. Work with your health care provider to identify foods that are safe foryou. The items listed above may not be a complete list of recommended foods and beverages. Contact a dietitian for more information. What foods should I avoid? Limiting some of these foods may help manage the symptoms of GERD. Everyone is different. Consult a dietitian or your health care provider to help youidentify the exact foods to avoid, if any. Fruits Any fruits prepared with added fat. Any fruits that cause symptoms. For some people this may include citrus fruits, such as oranges, grapefruit, pineapple,and lemons. Vegetables Deep-fried vegetables. [place] fries. Any vegetables prepared with added fat. Any vegetables that cause symptoms. For some people, this may include tomatoesand tomato products, chili peppers, onions and garlic, and horseradish. Grains Pastries or quick breads with added fat. Meats and other proteins High-fat meats, such as fatty beef or pork, hot dogs, ribs, ham, sausage, salami, and bacon. Fried meat or protein, including fried fish and friedchicken. Nuts and nut butters, in large amounts. Dairy Whole milk and chocolate milk. Sour cream. Cream. Ice cream. Cream cheese.Milkshakes. Fats and oils Butter. Margarine. Shortening. Ghee. Beverages Coffee and tea, with or without caffeine. Carbonated beverages. Sodas. Energy drinks. Fruit juice made with acidic fruits, such as orange or grapefruit.Tomato juice. Alcoholic drinks. Sweets and desserts Chocolate and cocoa. Donuts. Seasonings and condiments Pepper. Peppermint and spearmint. Added salt. Any  condiments, herbs, or seasonings that cause symptoms. For some people, this may include curry, hotsauce, or vinegar-based salad dressings. The items listed above may not be a complete list of foods and beverages to avoid. Contact a dietitian for more information. Questions to ask your health care provider Diet and lifestyle changes are usually the first steps that are taken to manage symptoms of GERD. If diet and lifestyle changes do not improve your symptoms,talk with your health care provider about taking medicines. Where to find more information International Foundation for Gastrointestinal Disorders: aboutgerd.org Summary When you have gastroesophageal reflux disease (GERD), food and lifestyle choices may be very helpful in easing the discomfort of GERD. Eat frequent, small meals instead of three large meals each day. Eat your meals slowly, in a relaxed setting. Avoid bending over or lying down until 2-3 hours after eating. Limit high-fat foods such as fatty meats or fried foods. This information is not intended to replace advice given to you by your health care provider. Make sure you discuss any questions you have with your healthcare provider. Document Revised: 08/23/2019 Document Reviewed: 08/23/2019 Elsevier Patient Education  2022 [employer].

## 2020-08-24 NOTE — Progress Notes (Signed)
Subjective:    Patient ID: Debra Stuart, female    DOB: April 24, 1995, 25 y.o.   MRN: [id number]  HPI  Unwanted weight loss , nausea, vomiting, diarrhea 2 to 3 months  Low blood sugar incident 2 weeks ago Daily and losing weight, states it is unintentional.  Has been working at a new job for 2 to 3 months in a [employer] facility with a significant increase in activity.  Also a very hot environment.  Has noticed increased diarrhea on the days that she works.  Does not really occur on her days off.  No bloody stools or change in the color.  Does create some issues at work when she has to stop to go to the restroom.  Occasional nausea, rare vomiting, has Zofran if needed.  Minimal abdominal pain.  Tries to stay hydrated but is not allowed to have a water bottle on the floor.  Drinks a [employer] essential breakfast every day in the morning.  Uses tobacco with cigarettes and vaping.  Rare alcohol use.  Limited caffeine intake.  Had 1 episode recently where she became very dizzy while on a ladder at work.  Went to see the occupational nurse, her sugar at that time was 80.  This was after a small amount of sugar intake.  Having occasional similar symptoms but no syncopal episodes.  Has an IUD for birth control.  Gets regular preventive health physicals.  Occasional acid reflux.  No chest pain.  Usually takes Tums for relief. No chest pain/ischemic type pain or shortness of breath.  No edema. Depression screen [facility] 2/9 08/24/2020 05/03/2020 03/08/2020 07/29/2019 05/26/2019  Decreased Interest 0 1 2 3 3   Down, Depressed, Hopeless 0 3 2 3 3   PHQ - 2 Score 0 4 4 6 6   Altered sleeping - 3 0 3 3  Tired, decreased energy - 3 3 2 2   Change in appetite - 3 2 2 2   Feeling bad or failure about yourself  - 1 1 2 3   Trouble concentrating - 1 3 1 1   Moving slowly or fidgety/restless - 3 1 2  0  Suicidal thoughts - 0 0 1 1  PHQ-9 Score - 18 14 19 18   Difficult doing work/chores - Extremely dIfficult Somewhat difficult -  Very difficult  Some encounter information is confidential and restricted. Go to Review Flowsheets activity to see all data.  Some recent data might be hidden       Objective:   Physical Exam NAD.  Alert, oriented.  Calm affect.  Making good eye contact.  Dressed appropriately.  Thoughts logical coherent and relevant.  Thyroid nontender to palpation, no mass or goiter noted.  Lungs clear.  Heart regular rate rhythm.  Abdomen soft nondistended with active bowel sounds x4; no epigastric or upper abdominal tenderness on exam.  Minimal mid lower abdominal tenderness.  No rebound or guarding.  No obvious masses. Today's Vitals   08/24/20 1411  BP: 94/66  Pulse: (!) 104  Temp: 98.2 F (36.8 C)  SpO2: 97%  Weight: 214 lb (97.1 kg)  Height: 5\' 3"  (1.6 m)   Body mass index is 37.91 kg/m. Weight loss of 32 pounds since March 2022      Assessment & Plan:   Problem List Items Addressed This Visit       Digestive   Gastroesophageal reflux disease without esophagitis - Primary   Relevant Medications   ondansetron (ZOFRAN-ODT) 4 MG disintegrating tablet   pantoprazole (PROTONIX) 40 MG tablet  Other Relevant Orders   CBC with Differential/Platelet (Completed)   Comprehensive metabolic panel (Completed)   TSH (Completed)   Irritable bowel syndrome with diarrhea   Relevant Medications   ondansetron (ZOFRAN-ODT) 4 MG disintegrating tablet   pantoprazole (PROTONIX) 40 MG tablet   Other Relevant Orders   CBC with Differential/Platelet (Completed)   Comprehensive metabolic panel (Completed)   TSH (Completed)   Other Visit Diagnoses     Fatigue, unspecified type       Relevant Orders   CBC with Differential/Platelet (Completed)   Comprehensive metabolic panel (Completed)   TSH (Completed)   Near syncope       Relevant Orders   CBC with Differential/Platelet (Completed)   Comprehensive metabolic panel (Completed)   TSH (Completed)      Meds ordered this encounter  Medications    ondansetron (ZOFRAN-ODT) 4 MG disintegrating tablet    Sig: Take 1 tablet (4 mg total) by mouth every 8 (eight) hours as needed for nausea or vomiting.    Dispense:  20 tablet    Refill:  0   pantoprazole (PROTONIX) 40 MG tablet    Sig: Take 1 tablet (40 mg total) by mouth daily.    Dispense:  30 tablet    Refill:  2    Order Specific Question:   Supervising Provider    Answer:   Babs Sciara [phone]   Lab work pending.  Continue Zofran as directed as needed nausea. Start pantoprazole as directed. Given written and verbal information on lifestyle factors affecting reflux disease. Will provide a letter through MyChart asking her employer to allow her bathroom breaks and to keep water on hand for frequent hydration.  Explained to patient that her blood pressure runs low and if she is slightly dehydrated it can cause her to feel faint.  Also recommend regular meals plus snacks.  Discussed the role of stress on her GI symptoms. Warning signs reviewed. Return for Recheck in 4-6 weeks. Call back sooner if any problems. 30 minutes was spent with the patient including previsit chart review, time spent with patient, discussion of health issues, review of data including medical record, and documentation of the visit.

## 2020-08-25 LAB — COMPREHENSIVE METABOLIC PANEL
ALT: 18 IU/L (ref 0–32)
AST: 13 IU/L (ref 0–40)
Albumin/Globulin Ratio: 2 (ref 1.2–2.2)
Albumin: 4.4 g/dL (ref 3.9–5.0)
Alkaline Phosphatase: 82 IU/L (ref 44–121)
BUN/Creatinine Ratio: 9 (ref 9–23)
BUN: 8 mg/dL (ref 6–20)
Bilirubin Total: 0.4 mg/dL (ref 0.0–1.2)
CO2: 23 mmol/L (ref 20–29)
Calcium: 9.7 mg/dL (ref 8.7–10.2)
Chloride: 101 mmol/L (ref 96–106)
Creatinine, Ser: 0.85 mg/dL (ref 0.57–1.00)
Globulin, Total: 2.2 g/dL (ref 1.5–4.5)
Glucose: 83 mg/dL (ref 65–99)
Potassium: 4.5 mmol/L (ref 3.5–5.2)
Sodium: 136 mmol/L (ref 134–144)
Total Protein: 6.6 g/dL (ref 6.0–8.5)
eGFR: 99 mL/min/{1.73_m2} (ref >59)

## 2020-08-25 LAB — CBC WITH DIFFERENTIAL/PLATELET
Basophils Absolute: 0.1 10*3/uL (ref 0.0–0.2)
Basos: 1 %
EOS (ABSOLUTE): 0.4 10*3/uL (ref 0.0–0.4)
Eos: 4 %
Hematocrit: 46.3 % (ref 34.0–46.6)
Hemoglobin: 15.5 g/dL (ref 11.1–15.9)
Immature Grans (Abs): 0 10*3/uL (ref 0.0–0.1)
Immature Granulocytes: 0 %
Lymphocytes Absolute: 3.2 10*3/uL — ABNORMAL HIGH (ref 0.7–3.1)
Lymphs: 31 %
MCH: 31.4 pg (ref 26.6–33.0)
MCHC: 33.5 g/dL (ref 31.5–35.7)
MCV: 94 fL (ref 79–97)
Monocytes Absolute: 0.7 10*3/uL (ref 0.1–0.9)
Monocytes: 7 %
Neutrophils Absolute: 5.9 10*3/uL (ref 1.4–7.0)
Neutrophils: 57 %
Platelets: 342 10*3/uL (ref 150–450)
RBC: 4.94 x10E6/uL (ref 3.77–5.28)
RDW: 12.9 % (ref 11.7–15.4)
WBC: 10.2 10*3/uL (ref 3.4–10.8)

## 2020-08-25 LAB — TSH: TSH: 0.784 u[IU]/mL (ref 0.450–4.500)

## 2020-08-26 ENCOUNTER — Encounter: Payer: Self-pay | Admitting: Nurse Practitioner

## 2020-08-26 DIAGNOSIS — K219 Gastro-esophageal reflux disease without esophagitis: Secondary | ICD-10-CM | POA: Insufficient documentation

## 2020-08-26 DIAGNOSIS — K580 Irritable bowel syndrome with diarrhea: Secondary | ICD-10-CM | POA: Insufficient documentation

## 2020-09-01 ENCOUNTER — Ambulatory Visit (INDEPENDENT_AMBULATORY_CARE_PROVIDER_SITE_OTHER): Payer: 59 | Admitting: Clinical

## 2020-09-01 ENCOUNTER — Other Ambulatory Visit: Payer: Self-pay

## 2020-09-01 DIAGNOSIS — F319 Bipolar disorder, unspecified: Secondary | ICD-10-CM | POA: Diagnosis not present

## 2020-09-01 NOTE — Progress Notes (Signed)
Virtual Visit via Telephone Note   I connected with Debra Stuart on 09/01/20 at  9:00 AM EDT by telephone and verified that I am speaking with the correct person using two identifiers.   Location: Patient: Home Provider: Office   I discussed the limitations, risks, security and privacy concerns of performing an evaluation and management service by telephone and the availability of in person appointments. I also discussed with the patient that there may be a patient responsible charge related to this service. The patient expressed understanding and agreed to proceed.       THERAPIST PROGRESS NOTE   Session Time: 9:00 AM-9:30AM   Participation Level: Active   Behavioral Response: CasualAlertDepressed   Type of Therapy: Individual Therapy   Treatment Goals addressed: Coping   Interventions: CBT   Summary: Debra Stuart is a 25 y.o. female who presents with Bipolar Disorder with Anxiety. The OPT therapist worked with the patient for her initial OPT treatment session. The OPT therapist utilized Motivational Interviewing to assist in creating therapeutic repore. The patient in the session was engaged and work in collaboration giving feedback about her triggers and symptoms over the past few weeks including choosing to stop taking her medication by her own without consulting with her prescriber who she notes is leaving the practice leaving the patient without a prescriber. The patient notes she will be actively looking for a new prescriber in her network/insurance coverage. The OPT therapist utilized Cognitive Behavioral Therapy through cognitive restructuring as well as worked with the patient on coping strategies to assist in management of mood and as she continues to work on getting in home task completed instead of serial multitasking and not complete. The patient notes she has ben able to get Dr.notes from her PCP for her IBS which helps decrease stress from her employer around her frequent  trips during work going to the rest room.   Suicidal/Homicidal: Nowithout intent/plan   Therapist Response: The OPT therapist worked with the patient for the patients scheduled session. The patient was engaged in her session and gave feedback in relation to triggers, symptoms, and behavior responses over the past few weeks. The OPT therapist worked with the patient utilizing an in session Cognitive Behavioral Therapy exercise. The patient was responsive in the session and verbalized, " I feel sick all the time and I am working with my PCP on getting my medication right to help me with my physical health symptoms". The OPT therapist worked with the patient on implementing positive thinking and reviewing self care, and balance of work leisure. The patient notes she [place] the [place].The patient spoke about doing better with resting more and not consistently filling her time up. The patient indicated her social interactions have been improving. The OPT therapist will continue treatment work with the patient in her next scheduled session.   Plan: Return again in 3 weeks.   Diagnosis:      Axis I: Bipolar Disorder w Anxiety.                             Axis II: No diagnosis   I discussed the assessment and treatment plan with the patient. The patient was provided an opportunity to ask questions and all were answered. The patient agreed with the plan and demonstrated an understanding of the instructions.   The patient was advised to call back or seek an in-person evaluation if the  symptoms worsen or if the condition fails to improve as anticipated.   I provided 30 minutes of non-face-to-face time during this encounter.   Debra Burn, LCSW   09/01/2020

## 2020-09-04 ENCOUNTER — Encounter: Payer: Self-pay | Admitting: Nurse Practitioner

## 2020-09-15 ENCOUNTER — Other Ambulatory Visit: Payer: Self-pay

## 2020-09-15 ENCOUNTER — Ambulatory Visit (INDEPENDENT_AMBULATORY_CARE_PROVIDER_SITE_OTHER): Payer: 59 | Admitting: Clinical

## 2020-09-15 DIAGNOSIS — F319 Bipolar disorder, unspecified: Secondary | ICD-10-CM | POA: Diagnosis not present

## 2020-09-15 NOTE — Progress Notes (Signed)
Virtual Visit via Video Note  I connected with Debra Stuart on 09/15/20 at 11:00 AM EDT by a video enabled telemedicine application and verified that I am speaking with the correct person using two identifiers.  Location: Patient: Home Provider: Office   I discussed the limitations of evaluation and management by telemedicine and the availability of in person appointments. The patient expressed understanding and agreed to proceed.  THERAPIST PROGRESS NOTE   Session Time: 11:00 AM-11:40AM   Participation Level: Active   Behavioral Response: CasualAlertDepressed   Type of Therapy: Individual Therapy   Treatment Goals addressed: Coping   Interventions: CBT   Summary: Debra Stuart is a 25 y.o. female who presents with Bipolar Disorder with Anxiety. The OPT therapist worked with the patient for her ongoing OPT treatment session. The OPT therapist utilized Motivational Interviewing to assist in creating therapeutic repore. The patient in the session was engaged and work in collaboration giving feedback about her triggers and symptoms over the past few weeks. The patient notes she will continue to actively looking for a new prescriber in her network/insurance coverage. The OPT therapist utilized Cognitive Behavioral Therapy through cognitive restructuring as well as worked with the patient on coping strategies to assist in management of mood and as she continues to work on getting in home task completed instead of serial multitasking and not complete. The patient notes she has been implementing more leisure and she has now got her car insurance renewed and her speeding ticket has been continued at this point.   Suicidal/Homicidal: Nowithout intent/plan   Therapist Response: The OPT therapist worked with the patient for the patients scheduled session. The patient was engaged in her session and gave feedback in relation to triggers, symptoms, and behavior responses over the past few weeks. The OPT  therapist worked with the patient utilizing an in session Cognitive Behavioral Therapy exercise. The patient was responsive in the session and verbalized, " I can definitely tell a difference with how I feel and I have a lot of stress that has been taken off of my shoulders". The OPT therapist worked with the patient on implementing positive thinking and reviewing self care, and balance of work leisure. The patient spoke about doing better with resting more and not consistently filling her time up. The patient indicated her social interactions have been improving. The OPT therapist will continue treatment work with the patient in her next scheduled session.   Plan: Return again in 3 weeks.   Diagnosis:      Axis I: Bipolar Disorder w Anxiety.                             Axis II: No diagnosis   I discussed the assessment and treatment plan with the patient. The patient was provided an opportunity to ask questions and all were answered. The patient agreed with the plan and demonstrated an understanding of the instructions.   The patient was advised to call back or seek an in-person evaluation if the symptoms worsen or if the condition fails to improve as anticipated.   I provided 40 minutes of non-face-to-face time during this encounter.   Winfred Burn, LCSW   09/15/2020

## 2020-09-29 ENCOUNTER — Ambulatory Visit (INDEPENDENT_AMBULATORY_CARE_PROVIDER_SITE_OTHER): Payer: 59 | Admitting: Clinical

## 2020-09-29 ENCOUNTER — Ambulatory Visit: Payer: 59 | Admitting: Nurse Practitioner

## 2020-09-29 ENCOUNTER — Other Ambulatory Visit: Payer: Self-pay

## 2020-09-29 DIAGNOSIS — F319 Bipolar disorder, unspecified: Secondary | ICD-10-CM

## 2020-09-29 NOTE — Progress Notes (Signed)
Virtual Visit via Video Note   I connected with Debra Stuart on 09/29/20 at 11:00 AM EDT by a video enabled telemedicine application and verified that I am speaking with the correct person using two identifiers.   Location: Patient: Home Provider: Office   I discussed the limitations of evaluation and management by telemedicine and the availability of in person appointments. The patient expressed understanding and agreed to proceed.   THERAPIST PROGRESS NOTE   Session Time: 11:00 AM-11:40AM   Participation Level: Active   Behavioral Response: CasualAlertDepressed   Type of Therapy: Individual Therapy   Treatment Goals addressed: Coping   Interventions: CBT   Summary: Debra Stuart is a 25 y.o. female who presents with Bipolar Disorder with Anxiety. The OPT therapist worked with the patient for her ongoing OPT treatment session. The OPT therapist utilized Motivational Interviewing to assist in creating therapeutic repore. The patient in the session was engaged and work in collaboration giving feedback about her triggers and symptoms over the past few weeks. 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 The OPT therapist utilized Cognitive Behavioral Therapy through cognitive restructuring as well as worked with the patient on coping strategies to assist in management of mood and  she continues to work on getting in home task completed instead of serial multitasking and not complete.    Suicidal/Homicidal: Nowithout intent/plan   Therapist Response: The OPT therapist worked with the patient for the patients scheduled session. The patient was engaged in her session and gave feedback in relation to triggers, symptoms, and behavior responses over the past few weeks. The OPT therapist worked with the patient utilizing an in session Cognitive Behavioral Therapy exercise. The patient was responsive  in the session and verbalized, " I can definitely tell a difference with how I feel since I left my job and I have a lot of stress that has been taken off of my shoulders and the door dash is helping me cover my finances". The OPT therapist worked with the patient on implementing positive thinking and reviewing self care, and balance of work leisure. The patient spoke about doing better with time management and not putting so much pressure on herself. The patient indicated her social interactions have been improving. The OPT therapist will continue treatment work with the patient in her next scheduled session.   Plan: Return again in 3 weeks.   Diagnosis:      Axis I: Bipolar Disorder w Anxiety.                             Axis II: No diagnosis   I discussed the assessment and treatment plan with the patient. The patient was provided an opportunity to ask questions and all were answered. The patient agreed with the plan and demonstrated an understanding of the instructions.   The patient was advised to call back or seek an in-person evaluation if the symptoms worsen or if the condition fails to improve as anticipated.   I provided 40 minutes of non-face-to-face time during this encounter.   Winfred Burn, LCSW   09/29/2020

## 2020-10-06 ENCOUNTER — Ambulatory Visit (INDEPENDENT_AMBULATORY_CARE_PROVIDER_SITE_OTHER): Payer: 59 | Admitting: Nurse Practitioner

## 2020-10-06 ENCOUNTER — Ambulatory Visit: Payer: 59 | Admitting: Family Medicine

## 2020-10-06 ENCOUNTER — Other Ambulatory Visit: Payer: Self-pay

## 2020-10-06 VITALS — BP 109/72 | Ht 63.0 in | Wt 209.0 lb

## 2020-10-06 DIAGNOSIS — K219 Gastro-esophageal reflux disease without esophagitis: Secondary | ICD-10-CM | POA: Diagnosis not present

## 2020-10-06 DIAGNOSIS — K580 Irritable bowel syndrome with diarrhea: Secondary | ICD-10-CM | POA: Diagnosis not present

## 2020-10-06 NOTE — Progress Notes (Signed)
   Subjective:    Patient ID: Debra Stuart, female    DOB: 1995/12/20, 25 y.o.   MRN: [id number]  HPI  Patient arrives for a follow up on stomach issues- states it has gotten better than it was. Has made some major changes in her life which has reduced her stress.  Stopped her previous job which was the main source of her stress and anxiety.  Healthier eating habits.  Her stomach issues are much improved.  Patient has stopped most of her cigarette use, now mainly vapes.  Since her stomach has improved, patient has discontinued her pantoprazole. Is looking into new employment.  States she has been donating plasma on a regular basis to supplement income.  States she is feeling much better mentally and has been working on healthy habits and losing weight.  Continues to have occasional diarrhea, no blood in her stool.  No fevers.  No change in the color of her stools.  No further nausea, is not taking her Zofran at this time. See previous note.     Objective:   Physical Exam NAD.  Alert, oriented.  Cheerful and smiling.  Lungs clear.  Heart regular rate and rhythm.  Abdomen soft nondistended with mild epigastric area discomfort with deep palpation.  Exam otherwise benign.  Today's Vitals   10/06/20 1417  BP: 109/72  Weight: 209 lb (94.8 kg)  Height: 5\' 3"  (1.6 m)   Body mass index is 37.02 kg/m.     Assessment & Plan:   Problem List Items Addressed This Visit       Digestive   Gastroesophageal reflux disease without esophagitis - Primary   Irritable bowel syndrome with diarrhea   Patient is showing significant improvement.  Continue pantoprazole for 2 more weeks.  Upper mid abdominal discomfort continues patient agrees to contact office.  Otherwise switch to as needed use of pantoprazole.  Encouraged continued healthy habits and weight loss efforts. Note that patient gets regular physicals with gynecology. Discussed risks associated with vaping.  Follow-up here as needed.

## 2020-10-07 ENCOUNTER — Encounter: Payer: Self-pay | Admitting: Nurse Practitioner

## 2020-10-20 ENCOUNTER — Ambulatory Visit (INDEPENDENT_AMBULATORY_CARE_PROVIDER_SITE_OTHER): Payer: 59 | Admitting: Clinical

## 2020-10-20 ENCOUNTER — Other Ambulatory Visit: Payer: Self-pay

## 2020-10-20 DIAGNOSIS — F319 Bipolar disorder, unspecified: Secondary | ICD-10-CM
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
## 2020-11-01 ENCOUNTER — Encounter: Payer: Self-pay | Admitting: Nurse Practitioner

## 2020-11-01 ENCOUNTER — Ambulatory Visit (INDEPENDENT_AMBULATORY_CARE_PROVIDER_SITE_OTHER): Payer: 59 | Admitting: Nurse Practitioner

## 2020-11-01 ENCOUNTER — Other Ambulatory Visit (INDEPENDENT_AMBULATORY_CARE_PROVIDER_SITE_OTHER): Payer: 59

## 2020-11-01 VITALS — BP 104/72 | HR 88 | Ht 63.0 in | Wt 203.1 lb

## 2020-11-01 DIAGNOSIS — R634 Abnormal weight loss: Secondary | ICD-10-CM

## 2020-11-01 DIAGNOSIS — R1084 Generalized abdominal pain: Secondary | ICD-10-CM

## 2020-11-01 DIAGNOSIS — R197 Diarrhea, unspecified: Secondary | ICD-10-CM

## 2020-11-01 DIAGNOSIS — R112 Nausea with vomiting, unspecified: Secondary | ICD-10-CM

## 2020-11-01 LAB — SEDIMENTATION RATE: Sed Rate: 1 mm/hr (ref 0–20)

## 2020-11-01 LAB — HIGH SENSITIVITY CRP: CRP, High Sensitivity: 0.97 mg/L (ref 0.000–5.000)

## 2020-11-01 MED ORDER — ONDANSETRON 4 MG PO TBDP: 4.0000 mg | ORAL_TABLET | Freq: Three times a day (TID) | ORAL | 0 refills | Status: DC | PRN

## 2020-11-01 NOTE — Progress Notes (Signed)
ASSESSMENT AND PLAN    # 25 year old female with 6 months history of nausea, vomiting, diarrhea and generalized abdominal pain associated with unintentional weight loss of ~ 50 pounds over the last year ( documented).   Symptoms possibly combination of anxiety and frequent cannabis use but need to consider other etiologies such as IBD.  --Infectious etiology much less likely but given use of antibiotic within last several months will check for C-diff  --Recent CBC, CMET normal. Obtain ESR, CRP, fecal lactoferrin --Refill Zofran ODT 4 mg TID prn --She agrees to discontinue THC for the next several weeks to see if any or all symptoms improve --Continue to work on Mudlogger of anxiety. She is looking to establish care with Psychiatry.  --Follow up with me in 6-8 weeks.  --Further evaluation, likely to include endoscopic workup, will depend on clinical course    HISTORY OF PRESENT ILLNESS     Chief Complaint : 6 months of abdominal pian, nausea, vomiting, diarrhea, weight loss  Debra Stuart is a 25 y.o. female with a past medical history significant for depression and anxiety, obesity, THC use. See PMH below for any additional history.   Patient gives a 6 month history of nausea, vomiting, non-bloody diarrhea and abdominal pain. Abdominal pain is sometimes in upper mid abdomen, other times in the lower mid abdomen. She also has mid to lower back pain but is unsure if related to GI symptoms. The abdominal pain generally gets better after an episode of vomiting though sometimes eating can help the pain ( if she has gone too long without eating).  She is symptomatic about 5 days out of the week. She may have several episodes of diarrhea a day. A couple of times she has vomited food eaten at least 9 hours prior. Zofran helps the nausea but she doesn't have any pills left.  She hasn't been febrile.  She has unintentionally lost 60 pounds over the last year.    She took Protonix for a week,  thought it made her abdomina pain worse. She has no history of GERD symptoms. No Arrington of Crohn's disease or UC. No colon cancer in family.   Patient saw PCP on 10/06/20 . She had stopped Protonix (thought it made abdominal pain worse). She was actually doing a lot better since quitting her job.  However, she has since started a new job ( different vocation) and feels stressed out again. She is trying to manage anxiety with breathing exercises and therapy .    Debra Stuart has used THC about 4-5 times a day for about a year. She says it helps with stress and also nausea.   Data Reviewed: 08/24/20 CBC normal CMP normal.   PREVIOUS GI EVALUATIONS:   none    Past Medical History:  Diagnosis Date   Abnormal Pap smear of cervix 08/04/2019   LLSIL, repeat in 1 year   Anxiety    Chronic knee pain    Chronic mental illness    Vision abnormalities      Past Surgical History:  Procedure Laterality Date   NO PAST SURGERIES     Family History  Problem Relation Age of Onset   Depression Mother    Seizures Father    Depression Father    Drug abuse Father    Anxiety disorder Sister    OCD Sister    Obsessive Compulsive Disorder Sister    Pancreatic cancer Maternal Grandmother    Depression Half-Brother  Diabetes Cousin    Colon cancer Neg Hx    Esophageal cancer Neg Hx    Stomach cancer Neg Hx    Social History   Tobacco Use   Smoking status: Some Days    Years: 1.00    Types: Cigarettes, E-cigarettes   Smokeless tobacco: Never  Vaping Use   Vaping Use: Every day   Start date: 04/11/2016  Substance Use Topics   Alcohol use: Not Currently    Comment: occasionally   Drug use: Yes    Types: Marijuana   Current Outpatient Medications  Medication Sig Dispense Refill   albuterol (VENTOLIN HFA) 108 (90 Base) MCG/ACT inhaler Inhale 2 puffs into the lungs every 4 (four) hours as needed for wheezing. 18 g 1   Ibuprofen 200 MG CAPS Take 200 mg by mouth as needed.     levonorgestrel  (LILETTA, 52 MG,) 19.5 MCG/DAY IUD IUD 1 each by Intrauterine route once.     Multiple Vitamin (MULTIVITAMIN WITH MINERALS) TABS tablet Take 1 tablet by mouth daily.     ondansetron (ZOFRAN-ODT) 4 MG disintegrating tablet Take 1 tablet (4 mg total) by mouth every 8 (eight) hours as needed for nausea or vomiting. (Patient not taking: Reported on 11/01/2020) 20 tablet 0   pantoprazole (PROTONIX) 40 MG tablet Take 1 tablet (40 mg total) by mouth daily. (Patient not taking: Reported on 11/01/2020) 30 tablet 2   No current facility-administered medications for this visit.   No Known Allergies   Review of Systems: Positive for back pain.  All other systems reviewed and negative except where noted in HPI.    PHYSICAL EXAM :    Wt Readings from Last 3 Encounters:  11/01/20 203 lb 2 oz (92.1 kg)  10/06/20 209 lb (94.8 kg)  08/24/20 214 lb (97.1 kg)    BP 104/72 (BP Location: Left Arm, Patient Position: Sitting, Cuff Size: Normal)   Pulse 88   Ht 5' 3"  (1.6 m)   Wt 203 lb 2 oz (92.1 kg)   SpO2 98%   BMI 35.98 kg/m  Constitutional:  Pleasant female in no acute distress. Psychiatric: Normal mood and affect. Behavior is normal. EENT: Pupils normal.  Conjunctivae are normal. No scleral icterus. Neck supple.  Cardiovascular: Normal rate, regular rhythm. No edema Pulmonary/chest: Effort normal and breath sounds normal. No wheezing, rales or rhonchi. Abdominal: Soft, nondistended, nontender. Bowel sounds active throughout. There are no masses palpable. No hepatomegaly. Neurological: Alert and oriented to person place and time. Skin: Skin is warm and dry. No rashes noted.  Tye Savoy, NP  11/01/2020, 9:52 AM

## 2020-11-01 NOTE — Progress Notes (Signed)
Agree with assessment and plan as outlined.  

## 2020-11-01 NOTE — Patient Instructions (Addendum)
It was my pleasure to provide care to you today. Based on our discussion, I am providing you with my recommendations below:  RECOMMENDATION(S):   Please abstain from use of marijuana   PRESCRIPTION MEDICATION(S):   We have sent the following medication(s) to your pharmacy:  Zofran  NOTE: If your medication(s) requires a PRIOR AUTHORIZATION, we will receive notification from your pharmacy. Once received, the process to submit for approval may take up to 7-10 business days. You will be contacted about any denials we have received from your insurance company as well as alternatives recommended by your provider.  LABS:   Please proceed to the basement level for lab work before leaving today. Press "B" on the elevator. The lab is located at the first door on the left as you exit the elevator.  HEALTHCARE LAWS AND MY CHART RESULTS:   Due to recent changes in healthcare laws, you may see results of your imaging and/or laboratory studies on MyChart before I have had a chance to review them.  I understand that in some cases there may be results that are confusing or concerning to you. Please understand that not all results are received at same time and often I may need to interpret multiple results in order to provide you with the best plan of care or course of treatment. Therefore, I ask that you please give me 48 hours to thoroughly review all your results before contacting my office for clarification.   FOLLOW UP:  I would like for you to follow up with me in 6-8 weeks. Please refer to the appointments within this After Visit Summary regarding the date and time of your appointment.  BMI:  If you are age 68 or younger, your body mass index should be between 19-25. Your Body mass index is 35.98 kg/m. If this is out of the aformentioned range listed, please consider follow up with your Primary Care Provider.   MY CHART:  The [facility] providers would like to encourage you to use [facility] to  communicate with providers for non-urgent requests or questions.  Due to long hold times on the telephone, sending your provider a message by [facility] may be a faster and more efficient way to get a response.  Please allow 48 business hours for a response.  Please remember that this is for non-urgent requests.   Thank you for trusting me with your gastrointestinal care!    Willette Cluster, NP

## 2020-11-10 ENCOUNTER — Ambulatory Visit (INDEPENDENT_AMBULATORY_CARE_PROVIDER_SITE_OTHER): Payer: 59 | Admitting: Clinical

## 2020-11-10 ENCOUNTER — Other Ambulatory Visit: Payer: Self-pay

## 2020-11-10 DIAGNOSIS — F319 Bipolar disorder, unspecified: Secondary | ICD-10-CM
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
## 2020-11-16 ENCOUNTER — Other Ambulatory Visit: Payer: 59 | Admitting: Advanced Practice Midwife

## 2020-12-21 ENCOUNTER — Ambulatory Visit: Payer: 59 | Admitting: Nurse Practitioner

## 2021-01-05 ENCOUNTER — Encounter: Payer: Self-pay | Admitting: Nurse Practitioner

## 2021-02-08 ENCOUNTER — Encounter (HOSPITAL_COMMUNITY): Payer: Self-pay

## 2021-03-08 ENCOUNTER — Ambulatory Visit (HOSPITAL_COMMUNITY): Payer: 59 | Admitting: Clinical

## 2021-04-11 ENCOUNTER — Ambulatory Visit (INDEPENDENT_AMBULATORY_CARE_PROVIDER_SITE_OTHER): Payer: 59 | Admitting: Clinical

## 2021-04-11 ENCOUNTER — Other Ambulatory Visit: Payer: Self-pay

## 2021-04-11 DIAGNOSIS — F331 Major depressive disorder, recurrent, moderate: Secondary | ICD-10-CM

## 2021-04-12 ENCOUNTER — Ambulatory Visit (HOSPITAL_COMMUNITY): Payer: Self-pay | Admitting: Clinical
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
## 2021-04-14 NOTE — Plan of Care (Signed)
Client was in agreement with the treatment plan. °

## 2021-04-19 ENCOUNTER — Ambulatory Visit (INDEPENDENT_AMBULATORY_CARE_PROVIDER_SITE_OTHER): Payer: 59 | Admitting: Psychiatry

## 2021-04-19 ENCOUNTER — Encounter (HOSPITAL_COMMUNITY): Payer: Self-pay | Admitting: Psychiatry

## 2021-04-19 ENCOUNTER — Other Ambulatory Visit: Payer: Self-pay

## 2021-04-19 VITALS — BP 129/66 | HR 104 | Temp 99.1°F | Ht 63.0 in | Wt 174.0 lb

## 2021-04-19 DIAGNOSIS — F3131 Bipolar disorder, current episode depressed, mild: Secondary | ICD-10-CM | POA: Diagnosis not present

## 2021-04-19 MED ORDER — LITHIUM CARBONATE 300 MG PO TABS: 300.0000 mg | ORAL_TABLET | Freq: Two times a day (BID) | ORAL | 0 refills | Status: DC
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
## 2021-05-03 ENCOUNTER — Ambulatory Visit (INDEPENDENT_AMBULATORY_CARE_PROVIDER_SITE_OTHER): Payer: 59 | Admitting: Clinical

## 2021-05-03 ENCOUNTER — Other Ambulatory Visit: Payer: Self-pay

## 2021-05-03 DIAGNOSIS — F3131 Bipolar disorder, current episode depressed, mild: Secondary | ICD-10-CM

## 2021-05-03 NOTE — Progress Notes (Signed)
? ?THERAPIST PROGRESS NOTE ? ?Session Time: 45 minutes ? ?Participation Level: Active ? ?Behavioral Response: CasualAlertEuthymic ? ?Type of Therapy: Individual Therapy ? ?Treatment Goals addressed: REDUCE OVERALL DEPRESSION SCORE BY A MINIMUM OF 25% ON THE PHQ-9 ? ?ProgressTowards Goals: Progressing ? ?Interventions: CBT and Supportive ? ?Summary:  ?Debra Stuart is a 26 y.o. female who presents for the scheduled session oriented times five, appropriately dressed, and friendly. Client denied hallucinations and delusions. ?Client reported on today she is feeling good. Client reported since she was last seen she met with the psychiatrist and was prescribed Lithium. Client reported she researched the medication and is scared to take the medicine because of the side effects. Client reported she is nervous to tell the doctor but will discuss it at her next appointment that she would like to try something else. Client reported otherwise she has no coping skills to help with her depression and anxiety symptoms. Client reported when things don't go as planned she feels that she overacts to minor things in a way others would not. Client reported a recent incident of accidentally leaving a bought purchase and cried hysterically until she was able to locate her bag at the store. Client reported her emotions flip on and off like a switch. Client reported she has been trying to incorporate more self care into her time. Client reported she does not have a daily routine that helps create a healthy pattern in her daily life. Client reported she would like to speak with a primary care physician about her eating habits because she goes without eating then overindulges. Client discussed safety planning with the therapist and made appropriate distinction for coping skills or activities to try, people to contact and emergency services in the event of a crisis. Client reported she will complete the assigned homework about SMART goals  for short term things she would like to accomplish. ?Evidence of progress towards goal: Client reported she has not made progress with following through on medication management.  Client reported she has taken her prescribed medications 0 out of 0 days a week since her psychiatry appointment. Client reported she has reservations about what was prescribed and will talk with the psychiatrist about finding an alternative.  Client also completed an updated PHQ-9 with the improved rating of 16 compared to her last visit on 04/11/2021 which was a 19. ? ?Flowsheet Row Counselor from 05/03/2021 in [facility]  ?PHQ-9 Total Score 16  ? ?  ?  ?Suicidal/Homicidal: Nowithout intent/plan ? ?Therapist Response:  ?Therapist began the appointment asking the client how she has been doing since last seen. ?Therapist used CBT to utilize active listening and positive emotional support towards her thoughts and feelings. ?Therapist used CBT to ask the client to describe how her daily functioning and activity is negatively impacted by her symptoms. ?Therapist used CBT to educate the client about crisis planning with her input of realistic activities and support persons she can utilize in the event of a crisis. ?Therapist used CBT to educate the client on SMART goals and have her to identify areas of her life that could be referring for short-term goals that would be achievable. ?Therapist used CBT to discuss progress towards treatment plan goal by asking the client about medication compliance since her psychiatry appointment. ?Therapist assigned client homework to create 1-3 short-term smart goals using the psychoeducational worksheet that she was provided by the therapist to clearly write her objectives. ?Client was scheduled for next appointment. ? ? ?  Plan: Return again in 5 weeks. ? ?Diagnosis: bipolar affective disorder, currently depressed mild ? ?Collaboration of Care: Primary Care Provider AEB therapist  provided the client with information to [facility] community health and wellness to establish with a primary care physician along with information to [facility] to establish with OB/GYN. ? ?Patient/Guardian was advised Release of Information must be obtained prior to any record release in order to collaborate their care with an outside provider. Patient/Guardian was advised if they have not already done so to contact the registration department to sign all necessary forms in order for [place] to release information regarding their care.  ? ?Consent: Patient/Guardian gives verbal consent for treatment and assignment of benefits for services provided during this visit. Patient/Guardian expressed understanding and agreed to proceed.  ? ?Gardner, LCSW ?05/03/2021 ? ?

## 2021-05-08 ENCOUNTER — Telehealth (INDEPENDENT_AMBULATORY_CARE_PROVIDER_SITE_OTHER): Payer: 59 | Admitting: Psychiatry

## 2021-05-08 DIAGNOSIS — F3131 Bipolar disorder, current episode depressed, mild: Secondary | ICD-10-CM

## 2021-05-08 MED ORDER — QUETIAPINE FUMARATE 50 MG PO TABS: 50.0000 mg | ORAL_TABLET | Freq: Every day | ORAL | 1 refills | Status: DC

## 2021-05-08 NOTE — Progress Notes (Signed)
BH MD/PA/NP OP Progress Note  05/08/2021 9:09 AM Debra Stuart  MRN:  [id number]  Virtual Visit via Video Note  I connected with Debra Stuart on 05/08/21 at  8:30 AM EDT by a video enabled telemedicine application and verified that I am speaking with the correct person using two identifiers.  Location: Patient: home Provider: off site   I discussed the limitations of evaluation and management by telemedicine and the availability of in person appointments. The patient expressed understanding and agreed to proceed.    I discussed the assessment and treatment plan with the patient. The patient was provided an opportunity to ask questions and all were answered. The patient agreed with the plan and demonstrated an understanding of the instructions.   The patient was advised to call back or seek an in-person evaluation if the symptoms worsen or if the condition fails to improve as anticipated.  I provided 10 minutes of non-face-to-face time during this encounter.   Mcneil Sober, NP   Chief Complaint: "I'm pretty good"  HPI: Debra Stuart is a 26 year old female presenting to [facility] for a follow up psychiatric evaluation. She has a psychiatric history of bipolar disorder, depression, and anxiety. She was previously prescribed Lithium for symptom management but reports that she was diagnosed with the flu shortly after receiving her Lithium prescription. Patient reports that she reviewed the sided effects, so she never started the Lithium. Patient prefers to switch to an alternate medication for symptom management because she believes she will not keep track of follow up lab draws. She is alert and oriented x 4, calm, pleasant and willing to engage. She reports a good mood and sleep cycle. Patient reports that she sleeps approximately 6-8 hours nightly now. She reports decreased appetite but denies weight loss. She reports difficulty concentrating, stating "my  brain won't let me do things". She is noted lying in the bed, stating that she just woke up. Patient denies suicidal or homicidal ideations, paranoia, delusional thought, or auditory or visual hallucinations.   Visit Diagnosis:    ICD-10-CM   1. Bipolar affective disorder, currently depressed, mild (HCC)  F31.31       Past Psychiatric History: bipolar disorder, depression, anxiety  Past Medical History:  Past Medical History:  Diagnosis Date   Abnormal Pap smear of cervix 08/04/2019   LLSIL, repeat in 1 year   Anxiety    Chronic knee pain    Chronic mental illness    Vision abnormalities     Past Surgical History:  Procedure Laterality Date   NO PAST SURGERIES      Family Psychiatric History: see below  Family History:  Family History  Problem Relation Age of Onset   Depression Mother    Seizures Father    Depression Father    Drug abuse Father    Anxiety disorder Sister    OCD Sister    Obsessive Compulsive Disorder Sister    Pancreatic cancer Maternal Grandmother    Depression Half-Brother    Diabetes Cousin    Colon cancer Neg Hx    Esophageal cancer Neg Hx    Stomach cancer Neg Hx     Social History:  Social History   Socioeconomic History   Marital status: Single    Spouse name: Not on file   Number of children: Not on file   Years of education: Not on file   Highest education level: Not on file  Occupational History   Not on  file  Tobacco Use   Smoking status: Some Days    Years: 1.00    Types: Cigarettes, E-cigarettes   Smokeless tobacco: Never  Vaping Use   Vaping Use: Every day   Start date: 04/11/2016  Substance and Sexual Activity   Alcohol use: Not Currently    Comment: occasionally   Drug use: Yes    Types: Marijuana   Sexual activity: Yes    Birth control/protection: I.U.D.  Other Topics Concern   Not on file  Social History Narrative   Not on file   Social Determinants of Health   Financial Resource Strain: Not on file  Food  Insecurity: Not on file  Transportation Needs: Not on file  Physical Activity: Not on file  Stress: Not on file  Social Connections: Not on file    Allergies: No Known Allergies  Metabolic Disorder Labs: Lab Results  Component Value Date   HGBA1C 5.2 09/08/2017   No results found for: PROLACTIN No results found for: CHOL, TRIG, HDL, CHOLHDL, VLDL, LDLCALC Lab Results  Component Value Date   TSH 0.784 08/24/2020   TSH 0.891 09/08/2017    Therapeutic Level Labs: No results found for: LITHIUM No results found for: VALPROATE No components found for:  CBMZ  Current Medications: Current Outpatient Medications  Medication Sig Dispense Refill   QUEtiapine (SEROQUEL) 50 MG tablet Take 1 tablet (50 mg total) by mouth at bedtime. 30 tablet 1   albuterol (VENTOLIN HFA) 108 (90 Base) MCG/ACT inhaler Inhale 2 puffs into the lungs every 4 (four) hours as needed for wheezing. 18 g 1   levonorgestrel (LILETTA, 52 MG,) 19.5 MCG/DAY IUD IUD 1 each by Intrauterine route once.     Multiple Vitamin (MULTIVITAMIN WITH MINERALS) TABS tablet Take 1 tablet by mouth daily.     No current facility-administered medications for this visit.     Musculoskeletal: Strength & Muscle Tone:  N/A virtual visit Gait & Station:  N/A Patient leans: N/A  Psychiatric Specialty Exam: Review of Systems  There were no vitals taken for this visit.There is no height or weight on file to calculate BMI.  General Appearance: Fairly Groomed  Eye Contact:  Good  Speech:  Clear and Coherent  Volume:  Normal  Mood:  Euthymic  Affect:  Congruent  Thought Process:  Goal Directed  Orientation:  Full (Time, Place, and Person)  Thought Content: Logical   Suicidal Thoughts:  No  Homicidal Thoughts:  No  Memory:  Immediate;   Good Recent;   Good Remote;   Good  Judgement:  Good  Insight:  Good  Psychomotor Activity:  NA  Concentration:  Concentration: Fair and Attention Span: Good  Recall:  Good  Fund of  Knowledge: Good  Language: Good  Akathisia:  NA  Handed:  Right  AIMS (if indicated): not done  Assets:  Communication Skills Desire for Improvement  ADL's:  Intact  Cognition: WNL  Sleep:  Good   Screenings: GAD-7    Advertising copywriter from 04/11/2021 in [facility] Office Visit from 07/06/2020 in [facility] Counselor from [facility]/05/2020 [facility] [facility]icine from 05/03/2020 in [facility] Office Visit from 07/29/2019 in [facility]  Total GAD-7 Score 18 10 18 21 15       PHQ2-9    Flowsheet Row Counselor from 05/03/2021 in [facility] Counselor from 04/11/2021 in [facility] Office Visit from 08/24/2020 in [place]  [facility] Counselor from 07/05/2020 in [facility] Telemedicine from 05/03/2020 in [facility]  PHQ-2 Total Score 4 5 0 6 4  PHQ-9 Total Score 16 19 -- 19 18      Flowsheet Row Counselor from 04/11/2021 in [facility] Counselor from 07/05/2020 in [facility] ED from 06/26/2020 in [facility] EMERGENCY DEPARTMENT  C-SSRS RISK CATEGORY No Risk No Risk No Risk        Assessment and Plan: Debra Stuart is a 26 year old female presenting to [facility] for a follow up psychiatric evaluation. She has a psychiatric history of bipolar disorder, depression, and anxiety. She was previously prescribed Lithium for symptom management but reports that she was diagnosed with the flu shortly after receiving her Lithium prescription and she reviewed the sided effects, so she never started the Lithium. Patient prefers to switch to an alternate medication for symptom management.   Collaboration of Care: Collaboration of Care: Medication Management AEB Medication  E-scribed to patient's preferred pharmacy.  1. Bipolar affective disorder, currently depressed, mild (HCC)  - QUEtiapine (SEROQUEL) 50 MG tablet; Take 1 tablet (50 mg total) by mouth at bedtime.  Dispense: 30 tablet; Refill: 1   Return to care in 6 weeks Continue therapy  Patient/Guardian was advised Release of Information must be obtained prior to any record release in order to collaborate their care with an outside provider. Patient/Guardian was advised if they have not already done so to contact the registration department to sign all necessary forms in order for [place] to release information regarding their care.   Consent: Patient/Guardian gives verbal consent for treatment and assignment of benefits for services provided during this visit. Patient/Guardian expressed understanding and agreed to proceed.    Mcneil Sober, NP 05/08/2021, 9:09 AM

## 2021-05-16 ENCOUNTER — Telehealth (HOSPITAL_COMMUNITY): Payer: Self-pay | Admitting: *Deleted

## 2021-05-16 NOTE — Telephone Encounter (Signed)
Fax request from pharmacy for patients Lithium to be sent in. Reviewed chart and she is not being prescribed Lithium, she is taking Seroquel. This message sent to her pharmacy. ?

## 2021-05-31 ENCOUNTER — Encounter (HOSPITAL_COMMUNITY): Payer: 59 | Admitting: Psychiatry

## 2021-06-11 ENCOUNTER — Ambulatory Visit (INDEPENDENT_AMBULATORY_CARE_PROVIDER_SITE_OTHER): Payer: 59 | Admitting: Clinical

## 2021-06-11 DIAGNOSIS — F3131 Bipolar disorder, current episode depressed, mild: Secondary | ICD-10-CM

## 2021-06-14 ENCOUNTER — Telehealth (INDEPENDENT_AMBULATORY_CARE_PROVIDER_SITE_OTHER): Payer: 59 | Admitting: Psychiatry

## 2021-06-14 DIAGNOSIS — F330 Major depressive disorder, recurrent, mild: Secondary | ICD-10-CM | POA: Diagnosis not present

## 2021-06-14 MED ORDER — QUETIAPINE FUMARATE ER 50 MG PO TB24: 100.0000 mg | ORAL_TABLET | Freq: Every day | ORAL | 1 refills | Status: DC

## 2021-06-14 NOTE — Progress Notes (Signed)
BH MD/PA/NP OP Progress Note ? ?06/14/2021 2:08 PM ?Debra LittenJulie Tay  ?MRN:  [id number] ? ?Virtual Visit via Video Note ? ?I connected with Debra LittenJulie Stuart on 06/14/21 at 11:00 AM EDT by a video enabled telemedicine application and verified that I am speaking with the correct person using two identifiers. ? ?Location: ?Patient: home ?Provider: offsite ?  ?I discussed the limitations of evaluation and management by telemedicine and the availability of in person appointments. The patient expressed understanding and agreed to proceed. ? ?  ?I discussed the assessment and treatment plan with the patient. The patient was provided an opportunity to ask questions and all were answered. The patient agreed with the plan and demonstrated an understanding of the instructions. ?  ?The patient was advised to call back or seek an in-person evaluation if the symptoms worsen or if the condition fails to improve as anticipated. ? ?I provided 5 minutes of non-face-to-face time during this encounter. ? ? ?Mcneil Sobericely Ananth Fiallos, NP  ? ?Chief Complaint:Medication management ? ?HPI: Debra LittenJulie Stuart is a is a 26 year old [facility] follow-up psychiatric evaluation.  She has a psychiatric history of depression and her symptoms are managed with Seroquel 50 mg at bedtime.  Patient reports mood improvement since starting Seroquel but continued episodes of depression.  Patient also reports being drowsy upon awakening after taking her medication.  Patient open to increasing Seroquel to 100 mg to manage symptoms more effectively and to start extended release formula to minimize the side effect of drowsiness. ? ?Patient is alert and oriented x4, calm, pleasant and willing to engage.  She reports an okay mood, sleep and appetite.  She denies suicidal or homicidal ideations, paranoia, delusional thought, auditory or visual hallucinations. ? ?Visit Diagnosis:  ?  ICD-10-CM   ?1. Mild episode of recurrent major  depressive disorder (HCC)  F33.0   ?  ? ? ?Past Psychiatric History: MDD ? ?Past Medical History:  ?Past Medical History:  ?Diagnosis Date  ? Abnormal Pap smear of cervix 08/04/2019  ? LLSIL, repeat in 1 year  ? Anxiety   ? Chronic knee pain   ? Chronic mental illness   ? Vision abnormalities   ?  ?Past Surgical History:  ?Procedure Laterality Date  ? NO PAST SURGERIES    ? ? ?Family Psychiatric History:  ? ? ?Family History:  ?Family History  ?Problem Relation Age of Onset  ? Depression Mother   ? Seizures Father   ? Depression Father   ? Drug abuse Father   ? Anxiety disorder Sister   ? OCD Sister   ? Obsessive Compulsive Disorder Sister   ? Pancreatic cancer Maternal Grandmother   ? Depression Half-Brother   ? Diabetes Cousin   ? Colon cancer Neg Hx   ? Esophageal cancer Neg Hx   ? Stomach cancer Neg Hx   ? ? ?Social History:  ?Social History  ? ?Socioeconomic History  ? Marital status: Single  ?  Spouse name: Not on file  ? Number of children: Not on file  ? Years of education: Not on file  ? Highest education level: Not on file  ?Occupational History  ? Not on file  ?Tobacco Use  ? Smoking status: Some Days  ?  Years: 1.00  ?  Types: Cigarettes, E-cigarettes  ? Smokeless tobacco: Never  ?Vaping Use  ? Vaping Use: Every day  ? Start date: 04/11/2016  ?Substance and Sexual Activity  ? Alcohol use: Not Currently  ?  Comment: occasionally  ? Drug use: Yes  ?  Types: Marijuana  ? Sexual activity: Yes  ?  Birth control/protection: I.U.D.  ?Other Topics Concern  ? Not on file  ?Social History Narrative  ? Not on file  ? ?Social Determinants of Health  ? ?Financial Resource Strain: Not on file  ?Food Insecurity: Not on file  ?Transportation Needs: Not on file  ?Physical Activity: Not on file  ?Stress: Not on file  ?Social Connections: Not on file  ? ? ?Allergies: No Known Allergies ? ?Metabolic Disorder Labs: ?Lab Results  ?Component Value Date  ? HGBA1C 5.2 09/08/2017  ? ?No results found for: PROLACTIN ?No results found  for: CHOL, TRIG, HDL, CHOLHDL, VLDL, LDLCALC ?Lab Results  ?Component Value Date  ? TSH 0.784 08/24/2020  ? TSH 0.891 09/08/2017  ? ? ?Therapeutic Level Labs: ?No results found for: LITHIUM ?No results found for: VALPROATE ?No components found for:  CBMZ ? ?Current Medications: ?Current Outpatient Medications  ?Medication Sig Dispense Refill  ? QUEtiapine (SEROQUEL XR) 50 MG TB24 24 hr tablet Take 2 tablets (100 mg total) by mouth at bedtime. 60 tablet 1  ? albuterol (VENTOLIN HFA) 108 (90 Base) MCG/ACT inhaler Inhale 2 puffs into the lungs every 4 (four) hours as needed for wheezing. 18 g 1  ? levonorgestrel (LILETTA, 52 MG,) 19.5 MCG/DAY IUD IUD 1 each by Intrauterine route once.    ? Multiple Vitamin (MULTIVITAMIN WITH MINERALS) TABS tablet Take 1 tablet by mouth daily.    ? ?No current facility-administered medications for this visit.  ? ? ? ?Musculoskeletal: ?Strength & Muscle Tone: N/A virtual visit ?Gait & Station: N/A ?Patient leans: N/A ? ?Psychiatric Specialty Exam: ?Review of Systems  ?Psychiatric/Behavioral:  Negative for hallucinations, self-injury and suicidal ideas.   ?All other systems reviewed and are negative.  ?There were no vitals taken for this visit.There is no height or weight on file to calculate BMI.  ?General Appearance: N/A  ?Eye Contact: N/A  ?Speech: Clear and coherent  ?Volume: Normal  ?Mood: Euthymic  ?Affect: N/A  ?Thought Process: Goal directed  ?Orientation:  Full (Time, Place, and Person)  ?Thought Content: Logical   ?Suicidal Thoughts:  No  ?Homicidal Thoughts:  No  ?Memory: Good  ?Judgement: Good  ?Insight: Good  ?Psychomotor Activity: N/A  ?Concentration: Good  ?Recall: Good  ?Fund of Knowledge: Good  ?Language: Good  ?Akathisia: N/A  ?Handed: Right  ?AIMS (if indicated): Virtual visit  ?Assets:  Communication Skills ?Desire for Improvement  ?ADL's:  Intact  ?Cognition: WNL  ?Sleep:  Good  ? ?Screenings: ?GAD-7   ? ?Flowsheet Row Counselor from 04/11/2021 in [facility] Office Visit from 07/06/2020 in [facility] Counselor from 07/05/2020 [facility] [facility]icine from 05/03/2020 in [facility] Office Visit from 07/29/2019 in [facility]  ?Total GAD-7 Score 18 10 18 21 15   ? ?  ? ?PHQ2-9   ? ?Flowsheet Row Counselor from 06/11/2021 in [facility] Counselor from 05/03/2021 in [facility] Counselor from 04/11/2021 in [facility] Office Visit from 08/24/2020 in [facility] Counselor from 07/05/2020 in [facility]  ?PHQ-2 Total Score 3 4 5  0 6  ?PHQ-9 Total Score 15 16 19  -- 19  ? ?  ? ?Flowsheet Row Counselor from 04/11/2021 in [facility] Counselor from 07/05/2020 in [facility] [facility]  from 06/26/2020 in [facility] EMERGENCY DEPARTMENT  ?C-SSRS RISK CATEGORY No Risk No Risk No Risk  ? ?  ? ? ? ?Assessment and Plan: Vali Capano is a is a 26 year old female presenting to [facility] behavioral health outpatient for follow-up psychiatric evaluation.  She has a psychiatric history of depression and her symptoms are managed with Seroquel 50 mg at bedtime.  Patient reports mood improvement since starting Seroquel but continued episodes of depression.  Patient also reports being drowsy upon awakening after taking her medication.  Patient open to increasing Seroquel to 100 mg to manage symptoms more effectively and to start extended release formula to minimize the side effect of drowsiness.  New order for Seroquel extended release 100 mg at bedtime completed ? ?Collaboration of Care: Collaboration of Care: Medication Management AEB Seroquel extended release 100 mg at bedtime E scribed to patient's preferred pharmacy. ? ?1. Mild episode of recurrent major depressive disorder (HCC) ?-  QUEtiapine (SEROQUEL XR) 50 MG TB24 24 hr tablet; Take 2 tablets (100 mg total) by mouth at bedtime.  Dispense: 60 tablet; Refill: 1  ? ?Return to care in 6 weeks ?Continue therapy ? ?Patient/Guardian was ad

## 2021-06-16 NOTE — Progress Notes (Signed)
? ?  THERAPIST PROGRESS NOTE ? ?Session Time: 40 minutes ? ?Participation Level: Active ? ?Behavioral Response: CasualAlertAnxious ? ?Type of Therapy: Individual Therapy ? ?Treatment Goals addressed: client will complete 80% of assigned homework ? ?ProgressTowards Goals: Progressing ? ?Interventions: CBT and Supportive ? ?Summary:  ?Debra Stuart is a 26 y.o. female who presents for the scheduled session oriented times five, appropriately dressed, and friendly. Client denied hallucinations and delusions. ?Client reported on today she is doing well. Client reported over the past 2 weeks having a depressive episode. Client reported she is not sure what brought it on. Client reported she had been sleeping more than usual but today she is feeling better. Client reported she started her medication regimen but will talk to the doctor about it because she has been feeling more drowsy after a month of being on it. Client discussed her moments of anxiety. Client reported she goes into a frame of mind that feels terrified, having racing thoughts, and looking for something to grasp. Client reported it happens sporadically but she is working to help herself feel grounded during those moments.  ?Evidence of progress towards goal:  client reported 2 things she uses during the week to help with her depression which are talking to herself kindly and practicing self care activities. ? ? ?Suicidal/Homicidal: Nowithout intent/plan ? ?Therapist Response:  ?Therapist began the appointment asking the client how she has been doing since last seen. ?Therapist used CBT to engage using active listening and positive emotional support towards her thoughts and feelings. ?Therapist used CBT to engage and ask the client about her medication compliance and side effects.  ?Therapist used CBT to engage and discuss the depression thought/behavior cycle in relation to her recent reported symptoms. ?Therapist used CBT ask the client to identify her  progress with frequency of use with coping skills with continued practice in her daily activity.    ?Therapist assigned the client homework to practice routine and continued self activities. ?Client was scheduled for next appointment. ? ? ?Plan: Return again in 5 weeks. ? ?Diagnosis: bipolar affective disorder current episode depressed mild ? ?Collaboration of Care: Patient refused AEB none requested by the client. ? ?Patient/Guardian was advised Release of Information must be obtained prior to any record release in order to collaborate their care with an outside provider. Patient/Guardian was advised if they have not already done so to contact the registration department to sign all necessary forms in order for [place] to release information regarding their care.  ? ?Consent: Patient/Guardian gives verbal consent for treatment and assignment of benefits for services provided during this visit. Patient/Guardian expressed understanding and agreed to proceed.  ? ?Neena Rhymes Dahmir Epperly, LCSW ?06/11/2021 ? ?

## 2021-07-04 ENCOUNTER — Other Ambulatory Visit (HOSPITAL_COMMUNITY)
Admission: RE | Admit: 2021-07-04 | Discharge: 2021-07-04 | Disposition: A | Discharge status: Home / Self Care | Payer: Medicaid Other | Source: Ambulatory Visit | Attending: Certified Nurse Midwife | Admitting: Certified Nurse Midwife

## 2021-07-04 ENCOUNTER — Ambulatory Visit (INDEPENDENT_AMBULATORY_CARE_PROVIDER_SITE_OTHER): Payer: Medicaid Other | Admitting: Certified Nurse Midwife

## 2021-07-04 ENCOUNTER — Encounter: Payer: Self-pay | Admitting: Certified Nurse Midwife

## 2021-07-04 VITALS — BP 121/75 | HR 86 | Wt 176.3 lb

## 2021-07-04 DIAGNOSIS — Z0000 Encounter for general adult medical examination without abnormal findings: Secondary | ICD-10-CM | POA: Diagnosis not present

## 2021-07-04 DIAGNOSIS — Z01419 Encounter for gynecological examination (general) (routine) without abnormal findings: Secondary | ICD-10-CM | POA: Diagnosis not present

## 2021-07-04 DIAGNOSIS — F901 Attention-deficit hyperactivity disorder, predominantly hyperactive type: Secondary | ICD-10-CM

## 2021-07-04 DIAGNOSIS — Z113 Encounter for screening for infections with a predominantly sexual mode of transmission: Secondary | ICD-10-CM | POA: Diagnosis present

## 2021-07-04 DIAGNOSIS — Z8659 Personal history of other mental and behavioral disorders: Secondary | ICD-10-CM

## 2021-07-04 DIAGNOSIS — Z8742 Personal history of other diseases of the female genital tract: Secondary | ICD-10-CM

## 2021-07-05 LAB — HIV ANTIBODY (ROUTINE TESTING W REFLEX): HIV Screen 4th Generation wRfx: NONREACTIVE

## 2021-07-05 LAB — HEPATITIS B SURFACE ANTIGEN: Hepatitis B Surface Ag: NEGATIVE

## 2021-07-05 LAB — HEPATITIS C ANTIBODY: Hep C Virus Ab: NONREACTIVE

## 2021-07-05 LAB — RPR, QUANT+TP ABS (REFLEX)
Rapid Plasma Reagin, Quant: 1:1 {titer} — ABNORMAL HIGH
T Pallidum Abs: NONREACTIVE

## 2021-07-05 LAB — RPR: RPR Ser Ql: REACTIVE — AB

## 2021-07-07 ENCOUNTER — Encounter: Payer: Self-pay | Admitting: Certified Nurse Midwife

## 2021-07-07 NOTE — Progress Notes (Signed)
? ?ANNUAL EXAM ?Patient name: Debra Stuart MRN [id number]  Date of birth: February 18, 1996 ?Chief Complaint:   ?Gynecologic Exam ? ?History of Present Illness:   ?Debra Stuart is a 26 y.o. G0P0000 Caucasian female being seen today for a routine annual exam.  ?Current complaints: No gynecological complaints, wants STI testing as she engaged in unprotected intercourse with 3 partners in the last month or so. ? ?No LMP recorded. (Menstrual status: IUD). ? ?Last pap 2021. Results were: NILM w/ HRHPV positive: type not specified. H/O abnormal pap: yes HPV presence only, cells were otherwise normal per pt. ?Last mammogram: Never (age).  ?Last colonoscopy: Never (age).  ? ? ?  07/04/2021  ?  1:57 PM 06/11/2021  ?  3:40 PM 05/03/2021  ?  8:40 AM 04/11/2021  ?  2:32 PM 08/24/2020  ?  2:11 PM  ?Depression screen PHQ 2/9  ?Decreased Interest 1    0  ?Down, Depressed, Hopeless 1    0  ?PHQ - 2 Score 2    0  ?Altered sleeping 2      ?Tired, decreased energy 1      ?Change in appetite 1      ?Feeling bad or failure about yourself  1      ?Trouble concentrating 2      ?Moving slowly or fidgety/restless 0      ?Suicidal thoughts 0      ?PHQ-9 Score 9      ?Difficult doing work/chores       ?  ? Information is confidential and restricted. Go to Review Flowsheets to unlock data.  ? ?  ? ?  07/04/2021  ?  1:57 PM 04/11/2021  ?  2:31 PM 07/06/2020  ?  4:15 PM 07/05/2020  ?  3:27 PM  ?GAD 7 : Generalized Anxiety Score  ?Nervous, Anxious, on Edge 3  2   ?Control/stop worrying 3  3   ?Worry too much - different things 3  1   ?Trouble relaxing 2  1   ?Restless 1  1   ?Easily annoyed or irritable 3  1   ?Afraid - awful might happen 1  1   ?Total GAD 7 Score 16  10   ?Anxiety Difficulty   Somewhat difficult   ?  ? Information is confidential and restricted. Go to Review Flowsheets to unlock data.  ? ?Review of Systems:   ?Pertinent items are noted in HPI ?Denies any headaches, blurred vision, fatigue, shortness of breath, chest pain, abdominal pain, abnormal  vaginal discharge/itching/odor/irritation, problems with periods, bowel movements, urination, or intercourse unless otherwise stated above. ?Pertinent History Reviewed:  ?Reviewed past medical,surgical, social and family history.  ?Reviewed problem list, medications and allergies. ?Physical Assessment:  ? ?Vitals:  ? 07/04/21 1346  ?BP: 121/75  ?Pulse: 86  ?Weight: 176 lb 4.8 oz (80 kg)  ? Body mass index is 31.23 kg/m?. ?  ?     Physical Examination:  ? General appearance - well appearing, and in no distress ? Mental status - alert, oriented to person, place, and time ? Psych:  She has a normal mood and affect ? Skin - warm and dry, normal color, no suspicious lesions noted ? Chest - effort normal, all lung fields clear to auscultation bilaterally ? Heart - normal rate and regular rhythm ? Neck:  midline trachea, no thyromegaly or nodules ?Breasts - breasts appear normal, no suspicious masses, no skin or nipple changes or axillary nodes ? Abdomen - soft, nontender, nondistended,  no masses or organomegaly ?Pelvic - VULVA: normal appearing vulva with no masses, tenderness or lesions  VAGINA: normal appearing vagina with normal color and discharge, no lesions  CERVIX: normal appearing cervix without discharge or lesions, no CMT, strings noted. Thin prep pap is done with HR HPV cotesting ? UTERUS: uterus is felt to be normal size, shape, consistency and nontender  ? ADNEXA: No adnexal masses or tenderness noted. ? Extremities:  No swelling or varicosities noted ? ?Chaperone present for exam ? ?No results found for this or any previous visit (from the past 24 hour(s)).  ?Assessment & Plan:  ?1. Women's annual routine gynecological examination ? ?2. Screening examination for STD (sexually transmitted disease) ?- Cytology - PAP( [facility]) ?- RPR ?- HIV antibody (with reflex) ?- Hepatitis C Antibody ?- Hepatitis B Surface AntiGEN ? ?3. History of abnormal cervical Pap smear ?- Cytology - PAP( [facility]) ? ?4. History  of bipolar disorder ?- Describes episodes of high energy with impulsivity (such has increased sex drive) followed by mild depressive episodes. Notes these seem to happen every two weeks.  ? ?5. ADHD (attention deficit hyperactivity disorder), predominantly hyperactive impulsive type ?- Demonstrates symptoms consistent with this diagnosis almost moreso than the bipolar disorder. Possibly a trauma response to her incredibly strict and critical religious upbringing. Pt is very bright, talkative, fidgety, describes swinging between hyperfocus and inability to concentrate (which also coincide with her high/low moods), and has started to do some self-exploration regarding ADHD in women. ?- Discussed the effect of the menstrual cycle on ADHD symptoms, which can mimic bipolar especially when undiagnosed and misunderstood by the pt (great deal of shame associated with the lower energy state she goes into every two weeks).  ?- Pt has a psychiatrist and therapist, encouraged her to discuss this possibility with them both and continue her own research. ? ?Will follow up results of pap smear and manage accordingly. ?Routine preventative health maintenance measures emphasized, encouraged using a barrier method with new partners. ?Please refer to After Visit Summary for other counseling recommendations.  ? ?Mammogram: @ 24yo, or sooner if problems ?Colonoscopy: @ 24yo, or sooner if problems ? ?Orders Placed This Encounter  ?Procedures  ? RPR  ? HIV antibody (with reflex)  ? Hepatitis C Antibody  ? Hepatitis B Surface AntiGEN  ? RPR, quant & T.pallidum antibodies  ? ?Meds: No orders of the defined types were placed in this encounter. ? ?Follow-up: Return if symptoms worsen or fail to improve. ? ?Bernerd Limbo, CNM ?07/07/2021 ?9:02 PM ?

## 2021-07-13 LAB — CYTOLOGY - PAP
Chlamydia: NEGATIVE
Comment: NEGATIVE
Comment: NEGATIVE
Comment: NORMAL
Neisseria Gonorrhea: NEGATIVE
Trichomonas: NEGATIVE

## 2021-07-18 ENCOUNTER — Encounter: Payer: Self-pay | Admitting: Certified Nurse Midwife

## 2021-07-24 ENCOUNTER — Telehealth (INDEPENDENT_AMBULATORY_CARE_PROVIDER_SITE_OTHER): Payer: 59 | Admitting: Psychiatry

## 2021-07-24 ENCOUNTER — Encounter (HOSPITAL_COMMUNITY): Payer: Self-pay | Admitting: Psychiatry

## 2021-07-24 DIAGNOSIS — F32A Depression, unspecified: Secondary | ICD-10-CM

## 2021-07-24 DIAGNOSIS — F900 Attention-deficit hyperactivity disorder, predominantly inattentive type: Secondary | ICD-10-CM

## 2021-07-24 DIAGNOSIS — F1290 Cannabis use, unspecified, uncomplicated: Secondary | ICD-10-CM

## 2021-07-24 DIAGNOSIS — F411 Generalized anxiety disorder: Secondary | ICD-10-CM

## 2021-07-24 MED ORDER — ATOMOXETINE HCL 40 MG PO CAPS: 40.0000 mg | ORAL_CAPSULE | Freq: Every day | ORAL | 3 refills | Status: DC

## 2021-07-24 NOTE — Progress Notes (Signed)
BH MD/PA/NP OP Progress Note Virtual Visit via Telephone Note  I connected with Debra Stuart on 07/24/21 at  1:30 PM EDT by telephone and verified that I am speaking with the correct person using two identifiers.  Location: Patient: home Provider: Clinic   I discussed the limitations, risks, security and privacy concerns of performing an evaluation and management service by telephone and the availability of in person appointments. I also discussed with the patient that there may be a patient responsible charge related to this service. The patient expressed understanding and agreed to proceed.   I provided 30 minutes of non-face-to-face time during this encounter.   07/24/2021 2:12 PM Debra Stuart  MRN:  [id number]  Chief Complaint: "I stopped Seroquel"  HPI:  History: 26 year old female seen today for follow up psychiatric evaluation. She has a psychiatric history of depression.  Patient was managed on Seroquel 100 mg daily but notes that she discontinued it.   Today she was unable to logon virtually so assessment was done over the phone.  During exam she was pleasant, cooperative, and engaged in conversation.  She informed Clinical research associate that she felt unheard at her last visit as she discussed her dislike with Seroquel and notes that the dose was increased.  Patient informed Clinical research associate that she disliked the sedating effects of Seroquel as well as the weight gain.  She informed Clinical research associate that she believes that she was given a diagnosis of bipolar by the provider however notes that it was not documented and notes that she felt the diagnosis was inappropriate.  Patient denies current symptoms of mania.  She does note that at times she feels distracted, has racing thoughts, and at times fluctuations in her mood.  She informed Clinical research associate that she was told by her OB/GYN that a lot of her symptoms can be related to her hormones.  Provider asked patient if she utilized illegal substances.  She notes that she smokes  marijuana daily.  Provider informed Clinical research associate that marijuana can exacerbate mental health conditions.  She endorsed understanding however notes that she feels that it is beneficial to her mental health.   Patient notes that she is able to cope with her anxiety and depression.  She informed Clinical research associate that she has tried several antidepressants in the past without success.  She notes that Zoloft made her feel like a zombie, Lexapro caused her to have shakes, Effexor caused her to vomit, and notes that she discontinued BuSpar because of potential side effects that it caused with marijuana.  Provider conducted a GAD-7 and patient scored a 16.  Provider also conducted PHQ-9 and patient scored a 14.  She notes that she sleeps approximately 5 to 7 hours nightly.  She notes that her appetite fluctuates.  Over the last year patient notes that she has lost over 100 pounds due to his appetite.  At that time she has back pain which she attributes to having larger breast.  Today she endorses symptoms of ADHD such as forgetfulness, disorganization, poor listening skill, not being attentive to mentally taxing task.  She does not want to start antidepressant.  She is agreeable to starting Strattera 40 mg to help symptoms of ADHD.  Potential side effects of medication and risks vs benefits of treatment vs non-treatment were explained and discussed. All questions were answered.Provider recommended patient reducing/discontinuing marijuana to help with mood.  No other concerns at this time.   visit Diagnosis:    ICD-10-CM   1. Attention deficit hyperactivity disorder (ADHD),  predominantly inattentive type  F90.0 atomoxetine (STRATTERA) 40 MG capsule    2. Marijuana use  F12.90     3. Generalized anxiety disorder  F41.1     4. Mild depression  F32.A       Past Psychiatric:Depression Past Medical History:  Past Medical History:  Diagnosis Date   Abnormal Pap smear of cervix 08/04/2019   LLSIL, repeat in 1 year   Anxiety     Chronic knee pain    Chronic mental illness    Vision abnormalities     Past Surgical History:  Procedure Laterality Date   NO PAST SURGERIES      Family Psychiatric History: Depression, father depression and substance use, sister anxiety, stress both ED, half brother patient  Family History:  Family History  Problem Relation Age of Onset   Depression Mother    Seizures Father    Depression Father    Drug abuse Father    Anxiety disorder Sister    OCD Sister    Obsessive Compulsive Disorder Sister    Pancreatic cancer Maternal Grandmother    Depression Half-Brother    Diabetes Cousin    Colon cancer Neg Hx    Esophageal cancer Neg Hx    Stomach cancer Neg Hx     Social History:  Social History   Socioeconomic History   Marital status: Single    Spouse name: Not on file   Number of children: Not on file   Years of education: Not on file   Highest education level: Not on file  Occupational History   Not on file  Tobacco Use   Smoking status: Some Days    Years: 1.00    Types: Cigarettes, E-cigarettes   Smokeless tobacco: Never  Vaping Use   Vaping Use: Every day   Start date: 04/11/2016  Substance and Sexual Activity   Alcohol use: Not Currently    Comment: occasionally   Drug use: Yes    Types: Marijuana   Sexual activity: Yes    Birth control/protection: I.U.D.  Other Topics Concern   Not on file  Social History Narrative   Not on file   Social Determinants of Health   Financial Resource Strain: Not on file  Food Insecurity: Food Insecurity Present   Worried About Running Out of Food in the Last Year: Sometimes true   Ran Out of Food in the Last Year: Never true  Transportation Needs: No Transportation Needs   Lack of Transportation (Medical): No   Lack of Transportation (Non-Medical): No  Physical Activity: Not on file  Stress: Not on file  Social Connections: Not on file    Allergies: No Known Allergies  Metabolic Disorder Labs: Lab  Results  Component Value Date   HGBA1C 5.2 09/08/2017   No results found for: PROLACTIN No results found for: CHOL, TRIG, HDL, CHOLHDL, VLDL, LDLCALC Lab Results  Component Value Date   TSH 0.784 08/24/2020   TSH 0.891 09/08/2017    Therapeutic Level Labs: No results found for: LITHIUM No results found for: VALPROATE No components found for:  CBMZ  Current Medications: Current Outpatient Medications  Medication Sig Dispense Refill   atomoxetine (STRATTERA) 40 MG capsule Take 1 capsule (40 mg total) by mouth daily. 30 capsule 3   albuterol (VENTOLIN HFA) 108 (90 Base) MCG/ACT inhaler Inhale 2 puffs into the lungs every 4 (four) hours as needed for wheezing. 18 g 1   levonorgestrel (LILETTA, 52 MG,) 19.5 MCG/DAY IUD IUD 1 each by  Intrauterine route once.     Multiple Vitamin (MULTIVITAMIN WITH MINERALS) TABS tablet Take 1 tablet by mouth daily.     QUEtiapine (SEROQUEL XR) 50 MG TB24 24 hr tablet Take 2 tablets (100 mg total) by mouth at bedtime. 60 tablet 1   No current facility-administered medications for this visit.     Musculoskeletal: Strength & Muscle Tone:  Unable to assess due to telephone visit Gait & Station:  Unable to assess due to telephone visit Patient leans: N/A  Psychiatric Specialty Exam: Review of Systems  There were no vitals taken for this visit.There is no height or weight on file to calculate BMI.  General Appearance:  Unable to assess due to telephone visit  Eye Contact:   Unable to assess due to telephone visit  Speech:  Clear and Coherent and Normal Rate  Volume:  Normal  Mood:  Anxious, Depressed, and Reports able to cope with anxiety and depression   Affect:  Appropriate and Congruent  Thought Process:  Coherent, Goal Directed, and Linear  Orientation:  Full (Time, Place, and Person)  Thought Content: WDL and Logical   Suicidal Thoughts:  No  Homicidal Thoughts:  No  Memory:  Immediate;   Fair Recent;   Fair Remote;   Fair  Judgement:   Good  Insight:  Good  Psychomotor Activity:  Normal  Concentration:  Concentration: Fair and Attention Span: Fair  Recall:  Good  Fund of Knowledge: Good  Language: Good  Akathisia:  No  Handed:  Right  AIMS (if indicated): not done  Assets:  Communication Skills Desire for Improvement Financial Resources/Insurance Housing Leisure Time Social Support  ADL's:  Intact  Cognition: WNL  Sleep:  Good   Screenings: GAD-7    Flowsheet Row Video Visit from 07/24/2021 in [facility] Office Visit from 07/04/2021 in Center for [employer] at [facility] Counselor from 04/11/2021 in [facility] Office Visit from 07/06/2020 in [facility] Counselor from 07/05/2020 in [facility]  Total GAD-7 Score 16 16 18 10 18       PHQ2-9    Flowsheet Row Video Visit from 07/24/2021 in [facility] Office Visit from 07/04/2021 in Center for 09/03/2021 Healthcare at [facility] Counselor from 06/11/2021 in [facility] Counselor from 05/03/2021 in [facility] Counselor from 04/11/2021 in [facility]  PHQ-2 Total Score 3 2 3 4 5   PHQ-9 Total Score 14 9 15 16 19       Flowsheet Row Counselor from 04/11/2021 in [facility] Counselor from [facility] [facility] from 06/26/2020 in [place] [facility]PART[facility]  C-SSRS RISK CATEGORY No Risk No Risk No Risk        Assessment and Plan: Patient endorses symptoms of marijuana use, anxiety, depression, and ADHD.  When writer that she disliked antidepressants in the past and does not want to restart any.   She is agreeable to starting Strattera 40 mg to help symptoms of ADHD.  Potential side effects of medication and risks vs  benefits of treatment vs non-treatment were explained and discussed. All questions were answered.Provider recommended patient reducing/discontinuing marijuana to help with mood.   1. Attention deficit hyperactivity disorder (ADHD), predominantly inattentive type  Start- atomoxetine (STRATTERA) 40 MG capsule; Take 1 capsule (40 mg total) by mouth daily.  Dispense: 30 capsule;  Refill: 3  2. Marijuana use   3. Generalized anxiety disorder   4. Mild depression  Follow up in 3 months   Collaboration of Care: Collaboration of Care: Other provider involved in patient's care AEB    Patient/Guardian was advised Release of Information must be obtained prior to any record release in order to collaborate their care with an outside provider. Patient/Guardian was advised if they have not already done so to contact the registration department to sign all necessary forms in order for [place] to release information regarding their care.   Consent: Patient/Guardian gives verbal consent for treatment and assignment of benefits for services provided during this visit. Patient/Guardian expressed understanding and agreed to proceed.    Shanna Cisco, NP 07/24/2021, 2:12 PM

## 2021-07-27 ENCOUNTER — Telehealth (HOSPITAL_COMMUNITY): Payer: Self-pay | Admitting: *Deleted

## 2021-07-27 NOTE — Telephone Encounter (Signed)
VM on writers phone asking for an animal support letter for her apartment complex. She was here on 5/30 to see Brittney NP but forgot to discuss it with her at that time. She has a future appt with her in Aug and July with Paige. SHe indicated on the VM she would like a letter sooner than later. Will forward the concern to NP and therapist.

## 2021-07-27 NOTE — Telephone Encounter (Signed)
Provider spoke with patient and informed her that she would have to register her animal with the state prior to letter being written. She endorsed understanding and agreed.

## 2021-07-31 ENCOUNTER — Encounter: Payer: Self-pay | Admitting: Certified Nurse Midwife

## 2021-08-27 ENCOUNTER — Encounter (HOSPITAL_COMMUNITY): Payer: Self-pay

## 2021-08-27 ENCOUNTER — Ambulatory Visit (HOSPITAL_COMMUNITY): Payer: 59 | Admitting: Clinical

## 2021-08-29 ENCOUNTER — Encounter: Payer: Self-pay | Admitting: Nurse Practitioner

## 2021-08-29 ENCOUNTER — Ambulatory Visit: Payer: Medicaid Other | Attending: Nurse Practitioner | Admitting: Nurse Practitioner

## 2021-08-29 VITALS — BP 118/78 | HR 85 | Temp 98.4°F | Ht 63.0 in | Wt 158.8 lb

## 2021-08-29 DIAGNOSIS — Z7689 Persons encountering health services in other specified circumstances: Secondary | ICD-10-CM

## 2021-08-29 DIAGNOSIS — F419 Anxiety disorder, unspecified: Secondary | ICD-10-CM | POA: Insufficient documentation

## 2021-08-29 DIAGNOSIS — F909 Attention-deficit hyperactivity disorder, unspecified type: Secondary | ICD-10-CM | POA: Insufficient documentation

## 2021-08-29 DIAGNOSIS — R87618 Other abnormal cytological findings on specimens from cervix uteri: Secondary | ICD-10-CM | POA: Insufficient documentation

## 2021-08-29 DIAGNOSIS — F32A Depression, unspecified: Secondary | ICD-10-CM | POA: Insufficient documentation

## 2021-08-29 DIAGNOSIS — G8929 Other chronic pain: Secondary | ICD-10-CM | POA: Insufficient documentation

## 2021-08-29 DIAGNOSIS — R5382 Chronic fatigue, unspecified: Secondary | ICD-10-CM | POA: Insufficient documentation

## 2021-08-29 DIAGNOSIS — M25569 Pain in unspecified knee: Secondary | ICD-10-CM | POA: Insufficient documentation

## 2021-08-29 DIAGNOSIS — E559 Vitamin D deficiency, unspecified: Secondary | ICD-10-CM | POA: Insufficient documentation

## 2021-08-29 DIAGNOSIS — Z792 Long term (current) use of antibiotics: Secondary | ICD-10-CM | POA: Insufficient documentation

## 2021-08-29 NOTE — Progress Notes (Signed)
Assessment & Plan:  Debra Stuart was seen today for new patient (initial visit).  Diagnoses and all orders for this visit:  Encounter to establish care  Chronic fatigue -     CBC with Differential -     Iron, TIBC and Ferritin Panel -     VITAMIN D 25 Hydroxy (Vit-D Deficiency, Fractures) -     CMP14+EGFR    Patient has been counseled on age-appropriate routine health concerns for screening and prevention. These are reviewed and up-to-date. Referrals have been placed accordingly. Immunizations are up-to-date or declined.    Subjective:   Chief Complaint  Patient presents with   New Patient (Initial Visit)   HPI Debra Stuart 26 y.o. female presents to office today to establish care.  She  has a past medical history of Abnormal Pap smear of cervix (08/04/2019 LSIL-repeat next year), Anxiety, ADHD, Chronic knee pain, Mild depression, and marijuana use.    ADHD: she is followed by behavioral health for this. Recently started on strattera 20m daily on Jul 24, 2021 however she stopped taking this about 3 weeks after starting due to undesirable side effects such as heart palpitations and not feeling like herself. She also stopped taking seroquel on her own in the past.  Today she states she would just like to see what her body can do on its own without taking any medications.   PER BH NOTES:  Patient notes that she is able to cope with her anxiety and depression.  She informed wProbation officerthat she has tried several antidepressants in the past without success.  She notes that Zoloft made her feel like a zombie, Lexapro caused her to have shakes, Effexor caused her to vomit, and notes that she discontinued BuSpar because of potential side effects that it caused with marijuana.    Endorses losing over 110-120 lbs in the past year and a half after contracting COVID.  Reports decreased appetite (smokes marijuana daily and states she is trying to cut back ).  Endorses either sleeping too much or not  enough.  Other associated symptoms include chronic fatigue.  Some of her symptoms may be related to marijuana use as well as depression however she declines any SSRIs today. Lab Results  Component Value Date   TSH 0.784 08/24/2020     Review of Systems  Constitutional:  Positive for weight loss. Negative for fever and malaise/fatigue.       Decreased appetite  HENT: Negative.  Negative for nosebleeds.   Eyes: Negative.  Negative for blurred vision, double vision and photophobia.  Respiratory: Negative.  Negative for cough and shortness of breath.   Cardiovascular: Negative.  Negative for chest pain, palpitations and leg swelling.  Gastrointestinal: Negative.  Negative for heartburn, nausea and vomiting.  Musculoskeletal: Negative.  Negative for myalgias.  Neurological: Negative.  Negative for dizziness, focal weakness, seizures and headaches.  Psychiatric/Behavioral:  Positive for depression. Negative for suicidal ideas. The patient is nervous/anxious and has insomnia.     Past Medical History:  Diagnosis Date   Abnormal Pap smear of cervix 08/04/2019   LLSIL, repeat in 1 year   Anxiety    Chronic knee pain    Chronic mental illness    Vision abnormalities     Past Surgical History:  Procedure Laterality Date   NO PAST SURGERIES      Family History  Problem Relation Age of Onset   Depression Mother    Seizures Father    Depression Father  Drug abuse Father    Anxiety disorder Sister    OCD Sister    Obsessive Compulsive Disorder Sister    Pancreatic cancer Maternal Grandmother    Depression Half-Brother    Diabetes Cousin    Colon cancer Neg Hx    Esophageal cancer Neg Hx    Stomach cancer Neg Hx     Social History Reviewed with no changes to be made today.   Outpatient Medications Prior to Visit  Medication Sig Dispense Refill   albuterol (VENTOLIN HFA) 108 (90 Base) MCG/ACT inhaler Inhale 2 puffs into the lungs every 4 (four) hours as needed for wheezing. 18 g  1   levonorgestrel (LILETTA, 52 MG,) 19.5 MCG/DAY IUD IUD 1 each by Intrauterine route once.     Multiple Vitamin (MULTIVITAMIN WITH MINERALS) TABS tablet Take 1 tablet by mouth daily.     penicillin v potassium (VEETID) 500 MG tablet Take 500 mg by mouth in the morning and at bedtime.     atomoxetine (STRATTERA) 40 MG capsule Take 1 capsule (40 mg total) by mouth daily. (Patient not taking: Reported on 08/29/2021) 30 capsule 3   QUEtiapine (SEROQUEL XR) 50 MG TB24 24 hr tablet Take 2 tablets (100 mg total) by mouth at bedtime. (Patient not taking: Reported on 08/29/2021) 60 tablet 1   No facility-administered medications prior to visit.    No Known Allergies     Objective:    BP 118/78 (BP Location: Right Arm, Patient Position: Sitting, Cuff Size: Normal)   Pulse 85   Temp 98.4 F (36.9 C) (Oral)   Ht $R'5\' 3"'Ja$  (1.6 m)   Wt 158 lb 12.8 oz (72 kg)   SpO2 97%   BMI 28.13 kg/m  Wt Readings from Last 3 Encounters:  08/29/21 158 lb 12.8 oz (72 kg)  07/04/21 176 lb 4.8 oz (80 kg)  04/19/21 174 lb (78.9 kg)    Physical Exam Vitals and nursing note reviewed.  Constitutional:      Appearance: She is well-developed.  HENT:     Head: Normocephalic and atraumatic.  Cardiovascular:     Rate and Rhythm: Normal rate and regular rhythm.     Heart sounds: Normal heart sounds. No murmur heard.    No friction rub. No gallop.  Pulmonary:     Effort: Pulmonary effort is normal. No tachypnea or respiratory distress.     Breath sounds: Normal breath sounds. No decreased breath sounds, wheezing, rhonchi or rales.  Chest:     Chest wall: No tenderness.  Abdominal:     General: Bowel sounds are normal.     Palpations: Abdomen is soft.  Musculoskeletal:        General: Normal range of motion.     Cervical back: Normal range of motion.  Skin:    General: Skin is warm and dry.  Neurological:     Mental Status: She is alert and oriented to person, place, and time.     Coordination: Coordination  normal.  Psychiatric:        Behavior: Behavior normal. Behavior is cooperative.        Thought Content: Thought content normal.        Judgment: Judgment normal.          Patient has been counseled extensively about nutrition and exercise as well as the importance of adherence with medications and regular follow-up. The patient was given clear instructions to go to ER or return to medical center if symptoms don't improve, worsen  or new problems develop. The patient verbalized understanding.   Follow-up: Return if symptoms worsen or fail to improve.   Gildardo Pounds, FNP-BC [facility], [place]   08/29/2021, 2:51 PM

## 2021-08-29 NOTE — Progress Notes (Signed)
Blood work

## 2021-08-30 ENCOUNTER — Encounter: Payer: Self-pay | Admitting: Nurse Practitioner

## 2021-08-30 LAB — CBC WITH DIFFERENTIAL/PLATELET
Basophils Absolute: 0.1 10*3/uL (ref 0.0–0.2)
Basos: 1 %
EOS (ABSOLUTE): 0.3 10*3/uL (ref 0.0–0.4)
Eos: 3 %
Hematocrit: 43.7 % (ref 34.0–46.6)
Hemoglobin: 14.9 g/dL (ref 11.1–15.9)
Immature Grans (Abs): 0 10*3/uL (ref 0.0–0.1)
Immature Granulocytes: 0 %
Lymphocytes Absolute: 3.7 10*3/uL — ABNORMAL HIGH (ref 0.7–3.1)
Lymphs: 44 %
MCH: 31.8 pg (ref 26.6–33.0)
MCHC: 34.1 g/dL (ref 31.5–35.7)
MCV: 93 fL (ref 79–97)
Monocytes Absolute: 0.5 10*3/uL (ref 0.1–0.9)
Monocytes: 6 %
Neutrophils Absolute: 4 10*3/uL (ref 1.4–7.0)
Neutrophils: 46 %
Platelets: 311 10*3/uL (ref 150–450)
RBC: 4.69 x10E6/uL (ref 3.77–5.28)
RDW: 11.9 % (ref 11.7–15.4)
WBC: 8.5 10*3/uL (ref 3.4–10.8)

## 2021-08-30 LAB — CMP14+EGFR
ALT: 14 IU/L (ref 0–32)
AST: 18 IU/L (ref 0–40)
Albumin/Globulin Ratio: 1.9 (ref 1.2–2.2)
Albumin: 4.5 g/dL (ref 3.9–5.0)
Alkaline Phosphatase: 71 IU/L (ref 44–121)
BUN/Creatinine Ratio: 9 (ref 9–23)
BUN: 8 mg/dL (ref 6–20)
Bilirubin Total: 0.4 mg/dL (ref 0.0–1.2)
CO2: 23 mmol/L (ref 20–29)
Calcium: 10.2 mg/dL (ref 8.7–10.2)
Chloride: 105 mmol/L (ref 96–106)
Creatinine, Ser: 0.87 mg/dL (ref 0.57–1.00)
Globulin, Total: 2.4 g/dL (ref 1.5–4.5)
Glucose: 84 mg/dL (ref 70–99)
Potassium: 4.8 mmol/L (ref 3.5–5.2)
Sodium: 141 mmol/L (ref 134–144)
Total Protein: 6.9 g/dL (ref 6.0–8.5)
eGFR: 96 mL/min/{1.73_m2} (ref >59)

## 2021-08-30 LAB — VITAMIN D 25 HYDROXY (VIT D DEFICIENCY, FRACTURES): Vit D, 25-Hydroxy: 34.6 ng/mL (ref 30.0–100.0)

## 2021-08-30 LAB — IRON,TIBC AND FERRITIN PANEL
Ferritin: 120 ng/mL (ref 15–150)
Iron Saturation: 37 % (ref 15–55)
Iron: 93 ug/dL (ref 27–159)
Total Iron Binding Capacity: 250 ug/dL (ref 250–450)
UIBC: 157 ug/dL (ref 131–425)

## 2021-09-03 ENCOUNTER — Encounter: Payer: Self-pay | Admitting: Certified Nurse Midwife

## 2021-09-25 ENCOUNTER — Ambulatory Visit (HOSPITAL_COMMUNITY): Payer: Self-pay | Admitting: Clinical

## 2021-10-22 ENCOUNTER — Encounter (HOSPITAL_COMMUNITY): Payer: Self-pay | Admitting: Psychiatry

## 2021-10-22 ENCOUNTER — Telehealth (INDEPENDENT_AMBULATORY_CARE_PROVIDER_SITE_OTHER): Payer: 59 | Admitting: Psychiatry

## 2021-10-22 DIAGNOSIS — F411 Generalized anxiety disorder: Secondary | ICD-10-CM

## 2021-10-22 DIAGNOSIS — F1290 Cannabis use, unspecified, uncomplicated: Secondary | ICD-10-CM | POA: Diagnosis not present

## 2021-10-22 DIAGNOSIS — F331 Major depressive disorder, recurrent, moderate: Secondary | ICD-10-CM

## 2021-10-22 DIAGNOSIS — F900 Attention-deficit hyperactivity disorder, predominantly inattentive type: Secondary | ICD-10-CM | POA: Diagnosis not present

## 2021-10-22 MED ORDER — HYDROXYZINE HCL 10 MG PO TABS: 10.0000 mg | ORAL_TABLET | Freq: Three times a day (TID) | ORAL | 3 refills | Status: AC | PRN

## 2021-10-22 MED ORDER — BUPROPION HCL ER (XL) 150 MG PO TB24: 150.0000 mg | ORAL_TABLET | ORAL | 3 refills | Status: DC

## 2021-10-22 NOTE — Progress Notes (Signed)
BH MD/PA/NP OP Progress Note Virtual Visit via Telephone Note  I connected with Debra Stuart on 10/22/21 at  1:00 PM EDT by telephone and verified that I am speaking with the correct person using two identifiers.  Location: Patient: home Provider: Clinic   I discussed the limitations, risks, security and privacy concerns of performing an evaluation and management service by telephone and the availability of in person appointments. I also discussed with the patient that there may be a patient responsible charge related to this service. The patient expressed understanding and agreed to proceed.   I provided 30 minutes of non-face-to-face time during this encounter.   10/22/2021 1:31 PM Debra Stuart  MRN:  [id number]  Chief Complaint: "Things has been a roller coaster "  HPI:  History: 26 year old female seen today for follow up psychiatric evaluation. She has a psychiatric history of depression.  Patient is managed on Strattera 40 mg daily. She notes that she discontinued it because of heart palpitation, poor memory, and poor appetite.     Today she was unable to logon virtually so assessment was done over the phone.  During exam she was pleasant, cooperative, and engaged in conversation.  She informed Clinical research associate that things has been a roller coaster. She notes that her anxiety is now at an all time high. She reports that her and her roommate have been having conflict. She notes that she was co-depended and now they are no longer speaking after getting into an argument about a broken AC. She also notes that she is worried about having to let her cat go as she can not afford to pay for him to stay at her apartment. She reports that she would like to utilize him as as a Art gallery manager told patient to to register her animal with the state of [place] and a letter would be written once the registration is received.  She endorsed understanding and agreed.  Patient notes the above exacerbates her  anxiety and depression.  Provider conducted a GAD-7 and patient scored a 15, at her last visit she scored a 16.  Provider also conducted PHQ-9 and patient scored a 16, at her last visit she scored a 14.  Patient reports her sleep fluctuates noting that she sleeps 4 to 12 hours nightly.  She endorses having a good appetite.  Today she denies SI/HI/VAH or mania.  Patient notes at times she feels paranoid.  She notes that this is irrational however since her roommate left she reports fearing to leave their home or go to the store.   Patient informed Clinical research associate that she smokes marijuana daily.  Provider informed patient that marijuana can exacerbate her mental health.  She endorsed understanding.  Provider recommend discontinuing/reducing her consumption.  She endorsed understanding.   Today she endorses symptoms of ADHD such as forgetfulness, disorganization, poor listening skill, not being attentive to mentally taxing task.    Patient has trialed Lexapro (cause shakes), Effexor (call vomit), Zoloft (made her feel like the lobby), Seroquel (disliked), and BuSpar without success.  She informed Clinical research associate that she would like to be on medications that does not make her feel outside of herself.  Provider informed patient that medication would have to be trialed as Clinical research associate is uncertain side effects that she may experience.  She endorsed understanding and agreed. Today she is agreeable to starting Wellbutrin XL 150 mg to help manage symptoms of ADHD and depression.  She is also agreeable to starting hydroxyzine 10 mg 3  times daily as needed to help manage anxiety.  Provider discussed gabapentin or an antipsychotic in the future if the above is unsuccessful. Potential side effects of medication and risks vs benefits of treatment vs non-treatment were explained and discussed. All questions were answered. She endorsed understanding and agreed.  At this time she does not wish to restart her Strattera.  No other concerns at this  time.       visit Diagnosis:    ICD-10-CM   1. Major depressive disorder, recurrent episode, moderate with anxious distress (HCC)  F33.1 buPROPion (WELLBUTRIN XL) 150 MG 24 hr tablet    2. Generalized anxiety disorder  F41.1 hydrOXYzine (ATARAX) 10 MG tablet    3. Marijuana use  F12.90     4. Attention deficit hyperactivity disorder (ADHD), predominantly inattentive type  F90.0 buPROPion (WELLBUTRIN XL) 150 MG 24 hr tablet      Past Psychiatric:Depression Past Medical History:  Past Medical History:  Diagnosis Date   Abnormal Pap smear of cervix 08/04/2019   LLSIL, repeat in 1 year   Anxiety    Chronic knee pain    Chronic mental illness    Vision abnormalities     Past Surgical History:  Procedure Laterality Date   NO PAST SURGERIES      Family Psychiatric History: Depression, father depression and substance use, sister anxiety, stress both ED, half brother patient  Family History:  Family History  Problem Relation Age of Onset   Depression Mother    Seizures Father    Depression Father    Drug abuse Father    Anxiety disorder Sister    OCD Sister    Obsessive Compulsive Disorder Sister    Pancreatic cancer Maternal Grandmother    Depression Half-Brother    Diabetes Cousin    Colon cancer Neg Hx    Esophageal cancer Neg Hx    Stomach cancer Neg Hx     Social History:  Social History   Socioeconomic History   Marital status: Single    Spouse name: Not on file   Number of children: Not on file   Years of education: Not on file   Highest education level: Not on file  Occupational History   Not on file  Tobacco Use   Smoking status: Some Days    Years: 1.00    Types: Cigarettes, E-cigarettes   Smokeless tobacco: Never  Vaping Use   Vaping Use: Every day   Start date: 04/11/2016  Substance and Sexual Activity   Alcohol use: Not Currently    Comment: occasionally   Drug use: Yes    Types: Marijuana   Sexual activity: Yes    Birth  control/protection: I.U.D.  Other Topics Concern   Not on file  Social History Narrative   Not on file   Social Determinants of Health   Financial Resource Strain: Low Risk  (05/26/2019)   Overall Financial Resource Strain (CARDIA)    Difficulty of Paying Living Expenses: Not very hard  Food Insecurity: Food Insecurity Present (07/04/2021)   Hunger Vital Sign    Worried About Running Out of Food in the Last Year: Sometimes true    Ran Out of Food in the Last Year: Never true  Transportation Needs: No Transportation Needs (07/04/2021)   PRAPARE - Administrator, Civil Service (Medical): No    Lack of Transportation (Non-Medical): No  Physical Activity: Inactive (05/26/2019)   Exercise Vital Sign    Days of Exercise per Week: 0  days    Minutes of Exercise per Session: 0 min  Stress: Stress Concern Present (05/26/2019)   [employer] of Occupational Health - Occupational Stress Questionnaire    Feeling of Stress : Very much  Social Connections: Socially Isolated (05/26/2019)   Social Connection and Isolation Panel [NHANES]    Frequency of Communication with Friends and Family: More than three times a week    Frequency of Social Gatherings with Friends and Family: Once a week    Attends Religious Services: Never    Database administrator or Organizations: No    Attends Engineer, structural: Never    Marital Status: Never married    Allergies: No Known Allergies  Metabolic Disorder Labs: Lab Results  Component Value Date   HGBA1C 5.2 09/08/2017   No results found for: "PROLACTIN" No results found for: "CHOL", "TRIG", "HDL", "CHOLHDL", "VLDL", "LDLCALC" Lab Results  Component Value Date   TSH 0.784 08/24/2020   TSH 0.891 09/08/2017    Therapeutic Level Labs: No results found for: "LITHIUM" No results found for: "VALPROATE" No results found for: "CBMZ"  Current Medications: Current Outpatient Medications  Medication Sig Dispense Refill    buPROPion (WELLBUTRIN XL) 150 MG 24 hr tablet Take 1 tablet (150 mg total) by mouth every morning. 30 tablet 3   hydrOXYzine (ATARAX) 10 MG tablet Take 1 tablet (10 mg total) by mouth 3 (three) times daily as needed. 90 tablet 3   albuterol (VENTOLIN HFA) 108 (90 Base) MCG/ACT inhaler Inhale 2 puffs into the lungs every 4 (four) hours as needed for wheezing. 18 g 1   levonorgestrel (LILETTA, 52 MG,) 19.5 MCG/DAY IUD IUD 1 each by Intrauterine route once.     Multiple Vitamin (MULTIVITAMIN WITH MINERALS) TABS tablet Take 1 tablet by mouth daily.     penicillin v potassium (VEETID) 500 MG tablet Take 500 mg by mouth in the morning and at bedtime.     No current facility-administered medications for this visit.     Musculoskeletal: Strength & Muscle Tone:  Unable to assess due to telephone visit Gait & Station:  Unable to assess due to telephone visit Patient leans: N/A  Psychiatric Specialty Exam: Review of Systems  There were no vitals taken for this visit.There is no height or weight on file to calculate BMI.  General Appearance:  Unable to assess due to telephone visit  Eye Contact:   Unable to assess due to telephone visit  Speech:  Clear and Coherent and Normal Rate  Volume:  Normal  Mood:  Anxious and Depressed  Affect:  Appropriate and Congruent  Thought Process:  Coherent, Goal Directed, and Linear  Orientation:  Full (Time, Place, and Person)  Thought Content: WDL and Logical   Suicidal Thoughts:  No  Homicidal Thoughts:  No  Memory:  Immediate;   Fair Recent;   Fair Remote;   Fair  Judgement:  Good  Insight:  Good  Psychomotor Activity:  Normal  Concentration:  Concentration: Fair and Attention Span: Fair  Recall:  Good  Fund of Knowledge: Good  Language: Good  Akathisia:  No  Handed:  Right  AIMS (if indicated): not done  Assets:  Communication Skills Desire for Improvement Financial Resources/Insurance Housing Leisure Time Social Support  ADL's:  Intact   Cognition: WNL  Sleep:  Good   Screenings: GAD-7    Flowsheet Row Video Visit from 10/22/2021 in [facility] Office Visit from 08/29/2021 in [facility] Video Visit from 07/24/2021 in [facility] Office Visit from 07/04/2021 in [facility] Counselor from 04/11/2021 in [facility]  Total GAD-7 Score 15 20 16 16 18       PHQ2-9    Flowsheet Row Video Visit from 10/22/2021 in [facility] Office Visit from 08[facility]29/2021 in [facility] Video Visit from 07/24/2021 in [facility] Office Visit from 07/04/2021 in [facility] Counselor from 06/11/2021 in [facility]  PHQ-2 Total Score 4 4 3 2 3   PHQ-9 Total Score 16 18 14 9 15       Flowsheet Row Counselor from 04/11/2021 in [facility] Counselor from [facility]05/2020 [facility] from 06/26/2020 in [place] [facility]  C-SSRS RISK CATEGORY No Risk No Risk No Risk        Assessment and Plan: Patient endorses symptoms of marijuana use, anxiety, depression, and ADHD.  Today she is agreeable to starting Wellbutrin XL 150 mg to help manage symptoms of ADHD and depression.  She is also agreeable to starting hydroxyzine 10 mg 3 times daily as needed to help manage anxiety.  Provider discussed gabapentin or an antipsychotic in the future if the above is unsuccessful.  She endorsed understanding and agreed.  At this time she does not want to restart Strattera.  1. Major depressive disorder, recurrent episode, moderate with anxious distress (HCC)  Start- buPROPion (WELLBUTRIN XL) 150 MG 24 hr tablet; Take 1 tablet (150 mg total) by mouth every morning.   Dispense: 30 tablet; Refill: 3  2. Generalized anxiety disorder  Start- hydrOXYzine (ATARAX) 10 MG tablet; Take 1 tablet (10 mg total) by mouth 3 (three) times daily as needed.  Dispense: 90 tablet; Refill: 3  3. Marijuana use  4. Attention deficit hyperactivity disorder (ADHD), predominantly inattentive type  Start- buPROPion (WELLBUTRIN XL) 150 MG 24 hr tablet; Take 1 tablet (150 mg total) by mouth every morning.  Dispense: 30 tablet; Refill: 3    Follow up in 3 months     09/04/2020, NP 10/22/2021, 1:31 PM

## 2022-01-14 ENCOUNTER — Encounter (HOSPITAL_COMMUNITY): Payer: Self-pay

## 2022-01-14 ENCOUNTER — Telehealth (HOSPITAL_COMMUNITY): Payer: Self-pay | Admitting: Psychiatry

## 2022-02-16 DIAGNOSIS — J029 Acute pharyngitis, unspecified: Secondary | ICD-10-CM | POA: Diagnosis not present

## 2022-02-16 DIAGNOSIS — J988 Other specified respiratory disorders: Secondary | ICD-10-CM | POA: Diagnosis not present

## 2022-09-03 ENCOUNTER — Other Ambulatory Visit: Payer: Self-pay

## 2022-09-03 ENCOUNTER — Emergency Department (HOSPITAL_COMMUNITY)
Admission: EM | Admit: 2022-09-03 | Discharge: 2022-09-03 | Disposition: A | Discharge status: Home / Self Care | Payer: Medicaid Other | Attending: Emergency Medicine | Admitting: Emergency Medicine

## 2022-09-03 DIAGNOSIS — N12 Tubulo-interstitial nephritis, not specified as acute or chronic: Secondary | ICD-10-CM | POA: Diagnosis not present

## 2022-09-03 DIAGNOSIS — D72829 Elevated white blood cell count, unspecified: Secondary | ICD-10-CM | POA: Diagnosis not present

## 2022-09-03 DIAGNOSIS — R109 Unspecified abdominal pain: Secondary | ICD-10-CM | POA: Diagnosis present

## 2022-09-03 LAB — BASIC METABOLIC PANEL
Anion gap: 9 (ref 5–15)
BUN: 11 mg/dL (ref 6–20)
CO2: 25 mmol/L (ref 22–32)
Calcium: 8.8 mg/dL — ABNORMAL LOW (ref 8.9–10.3)
Chloride: 103 mmol/L (ref 98–111)
Creatinine, Ser: 0.8 mg/dL (ref 0.44–1.00)
GFR, Estimated: >60 mL/min (ref >60)
Glucose, Bld: 123 mg/dL — ABNORMAL HIGH (ref 70–99)
Potassium: 4.1 mmol/L (ref 3.5–5.1)
Sodium: 137 mmol/L (ref 135–145)

## 2022-09-03 LAB — CBC
HCT: 44.3 % (ref 36.0–46.0)
Hemoglobin: 15.5 g/dL — ABNORMAL HIGH (ref 12.0–15.0)
MCH: 32.5 pg (ref 26.0–34.0)
MCHC: 35 g/dL (ref 30.0–36.0)
MCV: 92.9 fL (ref 80.0–100.0)
Platelets: 338 10*3/uL (ref 150–400)
RBC: 4.77 MIL/uL (ref 3.87–5.11)
RDW: 11.5 % (ref 11.5–15.5)
WBC: 13.9 10*3/uL — ABNORMAL HIGH (ref 4.0–10.5)
nRBC: 0 % (ref 0.0–0.2)

## 2022-09-03 LAB — URINALYSIS, ROUTINE W REFLEX MICROSCOPIC
Bacteria, UA: NONE SEEN
Bilirubin Urine: NEGATIVE
Glucose, UA: NEGATIVE mg/dL
Ketones, ur: NEGATIVE mg/dL
Nitrite: NEGATIVE
Protein, ur: 100 mg/dL — AB
RBC / HPF: >50 RBC/hpf (ref 0–5)
Specific Gravity, Urine: 1.015 (ref 1.005–1.030)
WBC, UA: >50 WBC/hpf (ref 0–5)
pH: 7 (ref 5.0–8.0)

## 2022-09-03 MED ORDER — ONDANSETRON HCL 4 MG/2ML IJ SOLN
4.0000 mg | Freq: Once | INTRAMUSCULAR | Status: AC
  Administered 2022-09-03: 4 mg via INTRAVENOUS
  Filled 2022-09-03: qty 2

## 2022-09-03 MED ORDER — SODIUM CHLORIDE 0.9 % IV SOLN
1.0000 g | Freq: Once | INTRAVENOUS | Status: AC
  Administered 2022-09-03: 1 g via INTRAVENOUS
  Filled 2022-09-03: qty 10

## 2022-09-03 MED ORDER — ONDANSETRON 4 MG PO TBDP: 4.0000 mg | ORAL_TABLET | Freq: Three times a day (TID) | ORAL | 0 refills | Status: AC | PRN

## 2022-09-03 MED ORDER — SODIUM CHLORIDE 0.9 % IV BOLUS
1000.0000 mL | Freq: Once | INTRAVENOUS | Status: AC
  Administered 2022-09-03: 1000 mL via INTRAVENOUS

## 2022-09-03 MED ORDER — KETOROLAC TROMETHAMINE 15 MG/ML IJ SOLN
15.0000 mg | Freq: Once | INTRAMUSCULAR | Status: AC
  Administered 2022-09-03: 15 mg via INTRAVENOUS
  Filled 2022-09-03: qty 1

## 2022-09-03 NOTE — ED Provider Notes (Signed)
[place] EMERGENCY DEPARTMENT AT [facility] Provider Note   CSN: [id number] Arrival date & time: 09/03/22  0206     History  Chief Complaint  Patient presents with   Flank Pain    Debra Stuart is a 27 y.o. female.  The history is provided by the patient and a parent.  Flank Pain This is a new problem. The current episode started 12 to 24 hours ago. The problem occurs constantly. The problem has been gradually worsening. Nothing relieves the symptoms.  Patient reports several days ago she began having dysuria and urinary frequency.  She was seen at an urgent care and was started on Keflex 4 times daily for presumed UTI. Over the past day she has had increasing pain now into her right flank.  No fevers but she has had associated nausea and vomiting No vaginal bleeding.  No previous history of kidney stones.  She has had UTIs in the past  She has never had any urologic surgery    Past Medical History:  Diagnosis Date   Abnormal Pap smear of cervix 08/04/2019   LLSIL, repeat in 1 year   Anxiety    Chronic knee pain    Chronic mental illness    Vision abnormalities     Home Medications Prior to Admission medications   Medication Sig Start Date End Date Taking? Authorizing Provider  ondansetron (ZOFRAN-ODT) 4 MG disintegrating tablet Take 1 tablet (4 mg total) by mouth every 8 (eight) hours as needed. 09/03/22  Yes Zadie Rhine, MD  albuterol (VENTOLIN HFA) 108 (90 Base) MCG/ACT inhaler Inhale 2 puffs into the lungs every 4 (four) hours as needed for wheezing. 03/28/20   Ladona Ridgel, Malena M, DO  buPROPion (WELLBUTRIN XL) 150 MG 24 hr tablet Take 1 tablet (150 mg total) by mouth every morning. 10/22/21   Shanna Cisco, NP  hydrOXYzine (ATARAX) 10 MG tablet Take 1 tablet (10 mg total) by mouth 3 (three) times daily as needed. 10/22/21   Shanna Cisco, NP  levonorgestrel (LILETTA, 52 MG,) 19.5 MCG/DAY IUD IUD 1 each by Intrauterine route once.    [provider]  Multiple Vitamin (MULTIVITAMIN WITH MINERALS) TABS tablet Take 1 tablet by mouth daily.    [provider]  penicillin v potassium (VEETID) 500 MG tablet Take 500 mg by mouth in the morning and at bedtime.    [provider]      Allergies    Patient has no known allergies.    Review of Systems   Review of Systems  Constitutional:  Negative for fever.  Gastrointestinal:  Positive for nausea and vomiting.  Genitourinary:  Positive for dysuria, flank pain and frequency.    Physical Exam Updated Vital Signs BP 101/63   Pulse 61   Temp 98.8 F (37.1 C) (Oral)   Resp 14   Ht 1.6 m (5\' 3" )   Wt 70.3 kg   SpO2 98%   BMI 27.46 kg/m  Physical Exam CONSTITUTIONAL: Well developed/well nourished HEAD: Normocephalic/atraumatic EYES: EOMI/PERRL NECK: supple no meningeal signs CV: S1/S2 noted, no murmurs/rubs/gallops noted LUNGS: Lungs are clear to auscultation bilaterally, no apparent distress ABDOMEN: soft, mild suprapubic tenderness, no rebound or guarding, bowel sounds noted throughout abdomen WU:[id number] cva tenderness NEURO: Pt is awake/alert/appropriate, moves all extremitiesx4.  No facial droop.   EXTREMITIES:  full ROM SKIN: warm, color normal  ED Results / Procedures / Treatments   Labs (all labs ordered are listed, but only abnormal results are  displayed) Labs Reviewed  URINALYSIS, ROUTINE W REFLEX MICROSCOPIC - Abnormal; Notable for the following components:      Result Value   APPearance HAZY (*)    Hgb urine dipstick MODERATE (*)    Protein, ur 100 (*)    Leukocytes,Ua LARGE (*)    All other components within normal limits  CBC - Abnormal; Notable for the following components:   WBC 13.9 (*)    Hemoglobin 15.5 (*)    All other components within normal limits  BASIC METABOLIC PANEL - Abnormal; Notable for the following components:   Glucose, Bld 123 (*)    Calcium 8.8 (*)    All other components within normal limits     EKG None  Radiology No results found.  Procedures Procedures    Medications Ordered in ED Medications  cefTRIAXone (ROCEPHIN) 1 g in sodium chloride 0.9 % 100 mL IVPB (0 g Intravenous Stopped 09/03/22 0549)  ondansetron (ZOFRAN) injection 4 mg (4 mg Intravenous Given 09/03/22 0448)  ketorolac (TORADOL) 15 MG/ML injection 15 mg (15 mg Intravenous Given 09/03/22 0448)  sodium chloride 0.9 % bolus 1,000 mL (0 mLs Intravenous Stopped 09/03/22 0549)    ED Course/ Medical Decision Making/ A&P Clinical Course as of 09/03/22 0625  Tue Sep 03, 2022  0422 WBC(!): 13.9 Leukocytosis [DW]  0422 Leukocytes,Ua(!): LARGE UTI noted [DW]  0422 Patient had negative pregnancy test on previous ER visit [DW]  [phone] Patient with recent UTI, now having flank pain and vomiting likely early pyelonephritis.  She just started outpatient antibiotics.  Patient was given IV fluids and IV antibiotics and is now improving.  She is taking p.o. fluids.  She is not septic appearing.  Low suspicion for ureteral stone.  Patient is safe for discharge home She already has home antibiotics.  Will add on Zofran [DW]    Clinical Course User Index [DW] Zadie Rhine, MD                             Medical Decision Making Amount and/or Complexity of Data Reviewed Labs: ordered. Decision-making details documented in ED Course.  Risk Prescription drug management.   This patient presents to the ED for concern of flank pain, this involves an extensive number of treatment options, and is a complaint that carries with it a high risk of complications and morbidity.  The differential diagnosis includes but is not limited to cholecystitis, cholelithiasis,  gastritis, uti, pyelonephritis, PID, ectopic pregnancy, TOA, ovarian torsion   Comorbidities that complicate the patient evaluation: Patient's presentation is complicated by their history of anxiety  Additional history obtained: Additional history obtained from  family Records reviewed Care Everywhere/External Records  Lab Tests: I Ordered, and personally interpreted labs.  The pertinent results include:  UTI   Medicines ordered and prescription drug management: I ordered medication including IV fluids, antibiotics, toradol  for presumed pyelonephritis Reevaluation of the patient after these medicines showed that the patient    improved  Test Considered: I considered CT imaging, patient is improving, will defer for now  Critical Interventions:   IV antibiotics  Reevaluation: After the interventions noted above, I reevaluated the patient and found that they have :improved  Complexity of problems addressed: Patient's presentation is most consistent with  acute presentation with potential threat to life or bodily function  Disposition: After consideration of the diagnostic results and the patient's response to treatment,  I feel that the patent would benefit from  discharge   .           Final Clinical Impression(s) / ED Diagnoses Final diagnoses:  Pyelonephritis    Rx / DC Orders ED Discharge Orders          Ordered    ondansetron (ZOFRAN-ODT) 4 MG disintegrating tablet  Every 8 hours PRN        09/03/22 0610              Zadie Rhine, MD 09/03/22 [phone]

## 2022-09-03 NOTE — ED Triage Notes (Addendum)
Pt was seen at [facility] yesterday and was dx with UTI was started on Keflex. Reports she taken two doses of antibiotics but is now having N&V and worsening right sided flank pain

## 2022-09-03 NOTE — ED Notes (Signed)
Pt ambulated to restroom. 

## 2022-09-03 NOTE — ED Notes (Signed)
ED Provider at bedside. 
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
## 2023-06-04 ENCOUNTER — Encounter: Payer: Self-pay | Admitting: Certified Nurse Midwife

## 2023-06-04 ENCOUNTER — Other Ambulatory Visit (HOSPITAL_COMMUNITY)
Admission: RE | Admit: 2023-06-04 | Discharge: 2023-06-04 | Disposition: A | Discharge status: Home / Self Care | Source: Ambulatory Visit | Attending: Certified Nurse Midwife | Admitting: Certified Nurse Midwife

## 2023-06-04 ENCOUNTER — Other Ambulatory Visit: Payer: Self-pay

## 2023-06-04 ENCOUNTER — Ambulatory Visit (INDEPENDENT_AMBULATORY_CARE_PROVIDER_SITE_OTHER): Admitting: Certified Nurse Midwife

## 2023-06-04 VITALS — BP 107/74 | HR 101 | Wt 155.0 lb

## 2023-06-04 DIAGNOSIS — Z1331 Encounter for screening for depression: Secondary | ICD-10-CM

## 2023-06-04 DIAGNOSIS — Z30433 Encounter for removal and reinsertion of intrauterine contraceptive device: Secondary | ICD-10-CM

## 2023-06-04 DIAGNOSIS — F902 Attention-deficit hyperactivity disorder, combined type: Secondary | ICD-10-CM | POA: Diagnosis not present

## 2023-06-04 DIAGNOSIS — Z1151 Encounter for screening for human papillomavirus (HPV): Secondary | ICD-10-CM | POA: Insufficient documentation

## 2023-06-04 DIAGNOSIS — Z30430 Encounter for insertion of intrauterine contraceptive device: Secondary | ICD-10-CM

## 2023-06-04 DIAGNOSIS — Z124 Encounter for screening for malignant neoplasm of cervix: Secondary | ICD-10-CM | POA: Insufficient documentation

## 2023-06-04 DIAGNOSIS — Z975 Presence of (intrauterine) contraceptive device: Secondary | ICD-10-CM

## 2023-06-04 DIAGNOSIS — Z113 Encounter for screening for infections with a predominantly sexual mode of transmission: Secondary | ICD-10-CM | POA: Diagnosis not present

## 2023-06-04 DIAGNOSIS — Z01419 Encounter for gynecological examination (general) (routine) without abnormal findings: Secondary | ICD-10-CM

## 2023-06-04 MED ORDER — BUPROPION HCL ER (SR) 100 MG PO TB12
100.0000 mg | ORAL_TABLET | Freq: Every day | ORAL | 6 refills | Status: DC
Start: 2023-06-04 — End: 2023-07-23

## 2023-06-04 MED ORDER — LEVONORGESTREL 20.1 MCG/DAY IU IUD
1.0000 | INTRAUTERINE_SYSTEM | Freq: Once | INTRAUTERINE | Status: AC
  Administered 2023-06-04: 1 via INTRAUTERINE

## 2023-06-10 ENCOUNTER — Encounter: Payer: Self-pay | Admitting: Certified Nurse Midwife

## 2023-06-10 LAB — CYTOLOGY - PAP
Chlamydia: NEGATIVE
Comment: NEGATIVE
Comment: NEGATIVE
Comment: NEGATIVE
Comment: NORMAL
Diagnosis: NEGATIVE
High risk HPV: NEGATIVE
Neisseria Gonorrhea: NEGATIVE
Trichomonas: NEGATIVE

## 2023-07-17 ENCOUNTER — Encounter: Payer: Self-pay | Admitting: Certified Nurse Midwife

## 2023-07-17 DIAGNOSIS — F902 Attention-deficit hyperactivity disorder, combined type: Secondary | ICD-10-CM

## 2023-07-23 MED ORDER — BUPROPION HCL ER (SR) 150 MG PO TB12
150.0000 mg | ORAL_TABLET | Freq: Two times a day (BID) | ORAL | 6 refills | Status: AC
Start: 2023-07-23 — End: ?

## 2024-02-27 ENCOUNTER — Other Ambulatory Visit (HOSPITAL_COMMUNITY): Payer: Self-pay

## 2024-03-03 ENCOUNTER — Encounter: Payer: Self-pay | Admitting: Certified Nurse Midwife
